# Patient Record
Sex: Female | Born: 1980 | Hispanic: No | Smoking: Never smoker
Health system: Southern US, Community
[De-identification: ages and names within clinical notes are randomized; demographics above are authoritative.]

## PROBLEM LIST (undated history)

## (undated) ENCOUNTER — Inpatient Hospital Stay (HOSPITAL_COMMUNITY): Payer: Self-pay

## (undated) DIAGNOSIS — D649 Anemia, unspecified: Secondary | ICD-10-CM

## (undated) HISTORY — PX: OTHER SURGICAL HISTORY: SHX169

## (undated) HISTORY — PX: AUGMENTATION MAMMAPLASTY: SUR837

## (undated) HISTORY — PX: NO PAST SURGERIES: SHX2092

## (undated) HISTORY — DX: Anemia, unspecified: D64.9

---

## 2011-02-09 DIAGNOSIS — K219 Gastro-esophageal reflux disease without esophagitis: Secondary | ICD-10-CM | POA: Insufficient documentation

## 2011-02-14 DIAGNOSIS — A048 Other specified bacterial intestinal infections: Secondary | ICD-10-CM | POA: Insufficient documentation

## 2011-03-10 DIAGNOSIS — G4730 Sleep apnea, unspecified: Secondary | ICD-10-CM | POA: Insufficient documentation

## 2011-10-04 ENCOUNTER — Ambulatory Visit (INDEPENDENT_AMBULATORY_CARE_PROVIDER_SITE_OTHER): Payer: BC Managed Care – PPO | Admitting: Physician Assistant

## 2011-10-04 VITALS — BP 121/64 | HR 90 | Temp 99.0°F | Resp 18 | Ht 65.0 in | Wt 124.0 lb

## 2011-10-04 DIAGNOSIS — N946 Dysmenorrhea, unspecified: Secondary | ICD-10-CM

## 2011-10-04 DIAGNOSIS — N949 Unspecified condition associated with female genital organs and menstrual cycle: Secondary | ICD-10-CM

## 2011-10-04 DIAGNOSIS — N644 Mastodynia: Secondary | ICD-10-CM

## 2011-10-04 DIAGNOSIS — N938 Other specified abnormal uterine and vaginal bleeding: Secondary | ICD-10-CM

## 2011-10-04 DIAGNOSIS — R102 Pelvic and perineal pain: Secondary | ICD-10-CM

## 2011-10-04 LAB — POCT CBC
Hemoglobin: 12.9 g/dL (ref 12.2–16.2)
MCHC: 31 g/dL — AB (ref 31.8–35.4)
MID (cbc): 0.6 (ref 0–0.9)
MPV: 12.7 fL (ref 0–99.8)
POC Granulocyte: 2.6 (ref 2–6.9)
POC MID %: 8.5 %M (ref 0–12)
Platelet Count, POC: 188 10*3/uL (ref 142–424)
RBC: 4.66 M/uL (ref 4.04–5.48)

## 2011-10-04 LAB — POCT URINALYSIS DIPSTICK
Blood, UA: NEGATIVE
Glucose, UA: NEGATIVE
Nitrite, UA: NEGATIVE
Protein, UA: NEGATIVE
Spec Grav, UA: 1.015
Urobilinogen, UA: 0.2

## 2011-10-04 LAB — POCT UA - MICROSCOPIC ONLY
Crystals, Ur, HPF, POC: NEGATIVE
RBC, urine, microscopic: NEGATIVE
Yeast, UA: NEGATIVE

## 2011-10-04 LAB — POCT URINE PREGNANCY: Preg Test, Ur: NEGATIVE

## 2011-10-04 LAB — POCT WET PREP WITH KOH
KOH Prep POC: NEGATIVE
Trichomonas, UA: NEGATIVE

## 2011-10-04 MED ORDER — NAPROXEN 500 MG PO TABS: ORAL_TABLET | ORAL | Status: DC

## 2011-10-04 NOTE — Progress Notes (Signed)
Subjective:    Patient ID: Cynthia Heath, female    DOB: 1980-03-21, 31 y.o.   MRN: [id number]  HPI 31 yr old female from [place] is brought in by a friend of her grandmother. She complains of R nipple pain on and off for years.  Worse when she in menstruating.  Last month she had 2 periods in the same month.  She always has dysmenorrhea, but it is worse right now than usual. Some has some itching and pain with urination. Some mild nausea, no vomiting. Last sexual encounter was in October 2012 and was a bad experience. She has been in [place] for 1 year and just moved to [facility]  Review of Systems  All other systems reviewed and are negative.       Objective:   Physical Exam  Nursing note and vitals reviewed. Constitutional: She is oriented to person, place, and time. She appears well-developed and well-nourished.  HENT:  Head: Normocephalic and atraumatic.  Cardiovascular: Normal rate, regular rhythm and normal heart sounds.   Pulmonary/Chest: Effort normal and breath sounds normal. Right breast exhibits tenderness (mild TTP of areola and nipple.). Right breast exhibits no inverted nipple, no mass, no nipple discharge and no skin change. Left breast exhibits no inverted nipple, no mass, no nipple discharge, no skin change and no tenderness. Breasts are symmetrical.  Abdominal: Soft. Bowel sounds are normal.  Genitourinary: Vagina normal and uterus normal.       Blood in vault and wiped away w/ large swab.  Wet prep and gen probe taken.  Neg CMT.  Bimanual unremarkable.  Neurological: She is alert and oriented to person, place, and time.  Skin: Skin is warm and dry.   Results for orders placed in visit on 10/04/11  POCT CBC      Component Value Range   WBC 7.5  4.6 - 10.2 K/uL   Lymph, poc 4.3 (*) 0.6 - 3.4   POC LYMPH PERCENT 56.8 (*) 10 - 50 %L   MID (cbc) 0.6  0 - 0.9   POC MID % 8.5  0 - 12 %M   POC Granulocyte 2.6  2 - 6.9   Granulocyte percent 34.7 (*) 37 - 80 %G   RBC 4.66   4.04 - 5.48 M/uL   Hemoglobin 12.9  12.2 - 16.2 g/dL   HCT, POC 13.0  86.5 - 47.9 %   MCV 89.3  80 - 97 fL   MCH, POC 27.7  27 - 31.2 pg   MCHC 31.0 (*) 31.8 - 35.4 g/dL   RDW, POC 78.4     Platelet Count, POC 188  142 - 424 K/uL   MPV 12.7  0 - 99.8 fL  POCT URINE PREGNANCY      Component Value Range   Preg Test, Ur Negative    POCT URINALYSIS DIPSTICK      Component Value Range   Color, UA yellow     Clarity, UA clear     Glucose, UA neg     Bilirubin, UA neg     Ketones, UA neg     Spec Grav, UA 1.015     Blood, UA neg     pH, UA 7.0     Protein, UA neg     Urobilinogen, UA 0.2     Nitrite, UA neg     Leukocytes, UA Negative    POCT UA - MICROSCOPIC ONLY      Component Value Range   WBC, Ur,  HPF, POC 0-2     RBC, urine, microscopic neg     Bacteria, U Microscopic neg     Mucus, UA neg     Epithelial cells, urine per micros 0-1     Crystals, Ur, HPF, POC neg     Casts, Ur, LPF, POC neg     Yeast, UA neg    POCT WET PREP WITH KOH      Component Value Range   Trichomonas, UA Negative     Clue Cells Wet Prep HPF POC 0-2     Epithelial Wet Prep HPF POC 0-10     Yeast Wet Prep HPF POC neg     Bacteria Wet Prep HPF POC 1+     RBC Wet Prep HPF POC tntc     WBC Wet Prep HPF POC 0-1     KOH Prep POC Negative         Assessment & Plan:  Dysfunctional uterine bleeding-unsure etiology-checking TSH.  If normal, can start OCPs depending on how frequently this happens and how much it bothers her. Pelvic pain/dysmenorrhea-rx sent Breast pain/worse with menstruation-present for years/normal breast exam. Continue monthly SBE for changes. Taught and encouraged monthly SBE after her period. Needs pap/CPE in the next few months.

## 2011-10-05 LAB — TSH: TSH: 2.426 u[IU]/mL (ref 0.350–4.500)

## 2011-10-06 LAB — GC/CHLAMYDIA PROBE AMP, GENITAL
Chlamydia, DNA Probe: POSITIVE — AB
GC Probe Amp, Genital: NEGATIVE

## 2011-10-06 MED ORDER — AZITHROMYCIN 250 MG PO TABS: ORAL_TABLET | ORAL | Status: AC

## 2011-10-06 NOTE — Addendum Note (Signed)
Addended by: Anders Simmonds on: 10/06/2011 05:09 PM   Modules accepted: Orders

## 2013-07-04 DIAGNOSIS — Z975 Presence of (intrauterine) contraceptive device: Secondary | ICD-10-CM | POA: Insufficient documentation

## 2013-07-04 DIAGNOSIS — G479 Sleep disorder, unspecified: Secondary | ICD-10-CM | POA: Insufficient documentation

## 2013-11-14 DIAGNOSIS — E66811 Obesity, class 1: Secondary | ICD-10-CM | POA: Insufficient documentation

## 2013-11-14 DIAGNOSIS — E669 Obesity, unspecified: Secondary | ICD-10-CM | POA: Insufficient documentation

## 2013-11-14 DIAGNOSIS — R7303 Prediabetes: Secondary | ICD-10-CM | POA: Insufficient documentation

## 2014-08-12 ENCOUNTER — Inpatient Hospital Stay (HOSPITAL_COMMUNITY)
Admission: AD | Admit: 2014-08-12 | Discharge: 2014-08-13 | Disposition: A | Discharge status: Home / Self Care | Payer: Medicaid Other | Source: Ambulatory Visit | Attending: Obstetrics & Gynecology | Admitting: Obstetrics & Gynecology

## 2014-08-12 ENCOUNTER — Encounter (HOSPITAL_COMMUNITY): Payer: Self-pay | Admitting: Medical

## 2014-08-12 DIAGNOSIS — Z3A01 Less than 8 weeks gestation of pregnancy: Secondary | ICD-10-CM | POA: Insufficient documentation

## 2014-08-12 DIAGNOSIS — O9989 Other specified diseases and conditions complicating pregnancy, childbirth and the puerperium: Secondary | ICD-10-CM | POA: Diagnosis not present

## 2014-08-12 DIAGNOSIS — R1011 Right upper quadrant pain: Secondary | ICD-10-CM | POA: Diagnosis present

## 2014-08-12 LAB — CBC WITH DIFFERENTIAL/PLATELET
BASOS ABS: 0 10*3/uL (ref 0.0–0.1)
BASOS PCT: 0 % (ref 0–1)
Band Neutrophils: 0 % (ref 0–10)
Blasts: 0 %
EOS ABS: 0.1 10*3/uL (ref 0.0–0.7)
EOS PCT: 1 % (ref 0–5)
HCT: 30.8 % — ABNORMAL LOW (ref 36.0–46.0)
HEMOGLOBIN: 10 g/dL — AB (ref 12.0–15.0)
LYMPHS PCT: 30 % (ref 12–46)
Lymphs Abs: 3.2 10*3/uL (ref 0.7–4.0)
MCH: 22.1 pg — ABNORMAL LOW (ref 26.0–34.0)
MCHC: 32.5 g/dL (ref 30.0–36.0)
MCV: 68.1 fL — AB (ref 78.0–100.0)
METAMYELOCYTES PCT: 0 %
MONO ABS: 1.1 10*3/uL — AB (ref 0.1–1.0)
MYELOCYTES: 0 %
Monocytes Relative: 10 % (ref 3–12)
NRBC: 0 /100{WBCs}
Neutro Abs: 6.4 10*3/uL (ref 1.7–7.7)
Neutrophils Relative %: 59 % (ref 43–77)
Other: 0 %
PROMYELOCYTES ABS: 0 %
Platelets: 283 10*3/uL (ref 150–400)
RBC: 4.52 MIL/uL (ref 3.87–5.11)
RDW: 17.5 % — ABNORMAL HIGH (ref 11.5–15.5)
WBC: 10.8 10*3/uL — AB (ref 4.0–10.5)

## 2014-08-12 LAB — URINALYSIS, ROUTINE W REFLEX MICROSCOPIC
BILIRUBIN URINE: NEGATIVE
GLUCOSE, UA: NEGATIVE mg/dL
HGB URINE DIPSTICK: NEGATIVE
KETONES UR: NEGATIVE mg/dL
LEUKOCYTES UA: NEGATIVE
Nitrite: NEGATIVE
Protein, ur: NEGATIVE mg/dL
SPECIFIC GRAVITY, URINE: 1.005 (ref 1.005–1.030)
Urobilinogen, UA: 0.2 mg/dL (ref 0.0–1.0)
pH: 6 (ref 5.0–8.0)

## 2014-08-12 LAB — COMPREHENSIVE METABOLIC PANEL
ALT: 15 U/L (ref 14–54)
AST: 17 U/L (ref 15–41)
Albumin: 3.9 g/dL (ref 3.5–5.0)
Alkaline Phosphatase: 44 U/L (ref 38–126)
Anion gap: 6 (ref 5–15)
BUN: 10 mg/dL (ref 6–20)
CHLORIDE: 106 mmol/L (ref 101–111)
CO2: 25 mmol/L (ref 22–32)
CREATININE: 0.76 mg/dL (ref 0.44–1.00)
Calcium: 9 mg/dL (ref 8.9–10.3)
GFR calc non Af Amer: >60 mL/min (ref >60)
GLUCOSE: 127 mg/dL — AB (ref 65–99)
Potassium: 3.9 mmol/L (ref 3.5–5.1)
SODIUM: 137 mmol/L (ref 135–145)
Total Bilirubin: 0.7 mg/dL (ref 0.3–1.2)
Total Protein: 7 g/dL (ref 6.5–8.1)

## 2014-08-12 LAB — OB RESULTS CONSOLE RPR: RPR: NONREACTIVE

## 2014-08-12 LAB — POCT PREGNANCY, URINE: Preg Test, Ur: POSITIVE — AB

## 2014-08-12 LAB — OB RESULTS CONSOLE HEPATITIS B SURFACE ANTIGEN: HEP B S AG: NEGATIVE

## 2014-08-12 LAB — OB RESULTS CONSOLE ANTIBODY SCREEN: ANTIBODY SCREEN: NEGATIVE

## 2014-08-12 MED ORDER — OXYCODONE HCL 5 MG PO TABS
5.0000 mg | ORAL_TABLET | Freq: Once | ORAL | Status: AC
  Administered 2014-08-13: 5 mg via ORAL
  Filled 2014-08-12: qty 1

## 2014-08-12 NOTE — MAU Note (Signed)
TODAY SHE  WENT  TO [facility]  TO CONFIRM  PREG-  TOLD  TO COME  HERE.    [place] GYN  DR.     FROM  [place]  SAYS SHE HAS ABD  PAIN    IN RIGHT UPPER SIDE  AND  BACK-  STARTED  ON Sunday-  HURTS  TO BREATHE-  NO  MEDS  FOR  PAIN.      NO VAG BLEEDING.      LAST SEX-   Thursday.

## 2014-08-12 NOTE — MAU Provider Note (Signed)
History     CSN: [id number]  Arrival date and time: 08/12/14 2026   First Provider Initiated Contact with Patient 08/12/14 2339      No chief complaint on file.  HPI  Ms. Cynthia Heath is a 34 y.o. [phone] at [redacted]w[redacted]d who presents to MAU today with complaint of RUQ abdominal pain since yesterday. She states pain seems to be getting worse. Pain is worse with deep inspiration and certain movements. She has not taken anything for pain. She states pain radiates around the side of her abdomen. She describes pain as sharp. She denies N/V/D, fever, UTI symptoms, vaginal bleeding or discharge. She denies any recent change in activity. Last PO intake at 2000 today.   OB History    Gravida Para Term Preterm AB TAB SAB Ectopic Multiple Living   Past Medical History  Diagnosis Date  . Preterm labor     Past Surgical History  Procedure Laterality Date  . No past surgeries      History reviewed. No pertinent family history.  History  Substance Use Topics  . Smoking status: Never Smoker   . Smokeless tobacco: Not on file  . Alcohol Use: Not on file    Allergies: No Known Allergies  No prescriptions prior to admission    Review of Systems  Constitutional: Negative for fever and malaise/fatigue.  Gastrointestinal: Positive for abdominal pain. Negative for nausea, vomiting, diarrhea and constipation.  Genitourinary: Negative for dysuria, urgency and frequency.       Neg -vaginal bleeding, discharge   Physical Exam   Blood pressure 103/65, pulse 85, temperature 98.9 F (37.2 C), temperature source Oral, resp. rate 16, height  (1.626 m), last menstrual period 06/25/2014.  Physical Exam  Nursing note and vitals reviewed. Constitutional: She is oriented to person, place, and time. She appears well-developed and well-nourished. No distress.  HENT:  Head: Normocephalic and atraumatic.  Cardiovascular: Normal rate.   Respiratory: Effort normal.  GI: Soft.  She exhibits no distension and no mass. There is tenderness (mild tenderness to palpation of the RUQ). There is no rebound, no guarding and no CVA tenderness.  Neurological: She is alert and oriented to person, place, and time.  Skin: Skin is warm and dry. No erythema.  Psychiatric: She has a normal mood and affect.   Results for orders placed or performed during the hospital encounter of 08/12/14 (from the past 24 hour(s))  Urinalysis, Routine w reflex microscopic (not at [facility])     Status: None   Collection Time: 08/12/14  8:53 PM  Result Value Ref Range   Color, Urine YELLOW YELLOW   APPearance CLEAR CLEAR   Specific Gravity, Urine 1.005 1.005 - 1.030   pH 6.0 5.0 - 8.0   Glucose, UA NEGATIVE NEGATIVE mg/dL   Hgb urine dipstick NEGATIVE NEGATIVE   Bilirubin Urine NEGATIVE NEGATIVE   Ketones, ur NEGATIVE NEGATIVE mg/dL   Protein, ur NEGATIVE NEGATIVE mg/dL   Urobilinogen, UA 0.2 0.0 - 1.0 mg/dL   Nitrite NEGATIVE NEGATIVE   Leukocytes, UA NEGATIVE NEGATIVE  Pregnancy, urine POC     Status: Abnormal   Collection Time: 08/12/14  8:59 PM  Result Value Ref Range   Preg Test, Ur POSITIVE (A) NEGATIVE  CBC with Differential/Platelet     Status: Abnormal   Collection Time: 08/12/14 10:44 PM  Result Value Ref Range   WBC 10.8 (H) 4.0 - 10.5  K/uL   RBC 4.52 3.87 - 5.11 MIL/uL   Hemoglobin 10.0 (L) 12.0 - 15.0 g/dL   HCT 16.130.8 (L) 09.636.0 - 04.546.0 %   MCV 68.1 (L) 78.0 - 100.0 fL   MCH 22.1 (L) 26.0 - 34.0 pg   MCHC 32.5 30.0 - 36.0 g/dL   RDW 40.917.5 (H) 81.111.5 - 91.415.5 %   Platelets 283 150 - 400 K/uL   Neutrophils Relative % 59 43 - 77 %   Lymphocytes Relative 30 12 - 46 %   Monocytes Relative 10 3 - 12 %   Eosinophils Relative 1 0 - 5 %   Basophils Relative 0 0 - 1 %   Band Neutrophils 0 0 - 10 %   Metamyelocytes Relative 0 %   Myelocytes 0 %   Promyelocytes Absolute 0 %   Blasts 0 %   nRBC 0 0 /100 WBC   Other 0 %   Neutro Abs 6.4 1.7 - 7.7 K/uL   Lymphs Abs 3.2 0.7 - 4.0 K/uL    Monocytes Absolute 1.1 (H) 0.1 - 1.0 K/uL   Eosinophils Absolute 0.1 0.0 - 0.7 K/uL   Basophils Absolute 0.0 0.0 - 0.1 K/uL   RBC Morphology SCHISTOCYTES NOTED ON SMEAR   Comprehensive metabolic panel     Status: Abnormal   Collection Time: 08/12/14 10:44 PM  Result Value Ref Range   Sodium 137 135 - 145 mmol/L   Potassium 3.9 3.5 - 5.1 mmol/L   Chloride 106 101 - 111 mmol/L   CO2 25 22 - 32 mmol/L   Glucose, Bld 127 (H) 65 - 99 mg/dL   BUN 10 6 - 20 mg/dL   Creatinine, Ser 7.820.76 0.44 - 1.00 mg/dL   Calcium 9.0 8.9 - 95.610.3 mg/dL   Total Protein 7.0 6.5 - 8.1 g/dL   Albumin 3.9 3.5 - 5.0 g/dL   AST 17 15 - 41 U/L   ALT 15 14 - 54 U/L   Alkaline Phosphatase 44 38 - 126 U/L   Total Bilirubin 0.7 0.3 - 1.2 mg/dL   GFR calc non Af Amer >60 >60 mL/min   GFR calc Af Amer >60 >60 mL/min   Anion gap 6 5 - 15  Lipase, blood     Status: None   Collection Time: 08/12/14 10:44 PM  Result Value Ref Range   Lipase 26 22 - 51 U/L    MAU Course  Procedures None  MDM UPT - negative UA, CBC, CMP and Lipase today Labs show no evidence of liver, gallbladder, kidney or pancreatic issues as a source of pain Most likely musculoskeletal.  Assessment and Plan  A: SIUP at [id number] RUQ abdominal pain  P: Discharge home Rx for Percocet given to patient First trimester precautions discussed Patient advised [facility] of [facility] with 2 previous pregnancies Patient may return to MAU as needed or if her condition were to change or worsen   Marny LowensteinJulie N Wenzel, PA-C  08/13/2014, 2:21 AM

## 2014-08-12 NOTE — MAU Note (Signed)
Pt not in lobby x 2 per phlebotomy

## 2014-08-13 LAB — LIPASE, BLOOD: Lipase: 26 U/L (ref 22–51)

## 2014-08-14 ENCOUNTER — Encounter (HOSPITAL_COMMUNITY): Payer: Self-pay | Admitting: *Deleted

## 2014-08-14 ENCOUNTER — Inpatient Hospital Stay (HOSPITAL_COMMUNITY): Payer: Medicaid Other

## 2014-08-14 ENCOUNTER — Inpatient Hospital Stay (HOSPITAL_COMMUNITY)
Admission: AD | Admit: 2014-08-14 | Discharge: 2014-08-14 | Disposition: A | Discharge status: Home / Self Care | Payer: Medicaid Other | Source: Ambulatory Visit | Attending: Obstetrics & Gynecology | Admitting: Obstetrics & Gynecology

## 2014-08-14 DIAGNOSIS — Z3A01 Less than 8 weeks gestation of pregnancy: Secondary | ICD-10-CM | POA: Diagnosis not present

## 2014-08-14 DIAGNOSIS — R1031 Right lower quadrant pain: Secondary | ICD-10-CM | POA: Insufficient documentation

## 2014-08-14 DIAGNOSIS — N831 Corpus luteum cyst of ovary, unspecified side: Secondary | ICD-10-CM

## 2014-08-14 DIAGNOSIS — O26899 Other specified pregnancy related conditions, unspecified trimester: Secondary | ICD-10-CM

## 2014-08-14 DIAGNOSIS — R1011 Right upper quadrant pain: Secondary | ICD-10-CM | POA: Insufficient documentation

## 2014-08-14 DIAGNOSIS — O9989 Other specified diseases and conditions complicating pregnancy, childbirth and the puerperium: Secondary | ICD-10-CM | POA: Diagnosis not present

## 2014-08-14 DIAGNOSIS — O3481 Maternal care for other abnormalities of pelvic organs, first trimester: Secondary | ICD-10-CM | POA: Insufficient documentation

## 2014-08-14 LAB — URINALYSIS, ROUTINE W REFLEX MICROSCOPIC
Bilirubin Urine: NEGATIVE
Glucose, UA: NEGATIVE mg/dL
Hgb urine dipstick: NEGATIVE
Ketones, ur: NEGATIVE mg/dL
Leukocytes, UA: NEGATIVE
Nitrite: NEGATIVE
Protein, ur: NEGATIVE mg/dL
SPECIFIC GRAVITY, URINE: 1.025 (ref 1.005–1.030)
UROBILINOGEN UA: 0.2 mg/dL (ref 0.0–1.0)
pH: 6 (ref 5.0–8.0)

## 2014-08-14 LAB — COMPREHENSIVE METABOLIC PANEL
ALK PHOS: 45 U/L (ref 38–126)
ALT: 16 U/L (ref 14–54)
AST: 13 U/L — ABNORMAL LOW (ref 15–41)
Albumin: 3.7 g/dL (ref 3.5–5.0)
Anion gap: 5 (ref 5–15)
BUN: 10 mg/dL (ref 6–20)
CHLORIDE: 105 mmol/L (ref 101–111)
CO2: 24 mmol/L (ref 22–32)
Calcium: 8.6 mg/dL — ABNORMAL LOW (ref 8.9–10.3)
Creatinine, Ser: 0.68 mg/dL (ref 0.44–1.00)
GLUCOSE: 97 mg/dL (ref 65–99)
Potassium: 3.7 mmol/L (ref 3.5–5.1)
SODIUM: 134 mmol/L — AB (ref 135–145)
Total Bilirubin: 0.5 mg/dL (ref 0.3–1.2)
Total Protein: 6.7 g/dL (ref 6.5–8.1)

## 2014-08-14 LAB — CBC
HCT: 30.2 % — ABNORMAL LOW (ref 36.0–46.0)
Hemoglobin: 10.3 g/dL — ABNORMAL LOW (ref 12.0–15.0)
MCH: 23.1 pg — AB (ref 26.0–34.0)
MCHC: 34.1 g/dL (ref 30.0–36.0)
MCV: 67.7 fL — AB (ref 78.0–100.0)
Platelets: 284 10*3/uL (ref 150–400)
RBC: 4.46 MIL/uL (ref 3.87–5.11)
RDW: 17.2 % — ABNORMAL HIGH (ref 11.5–15.5)
WBC: 9.7 10*3/uL (ref 4.0–10.5)

## 2014-08-14 LAB — ABO/RH: ABO/RH(D): O POS

## 2014-08-14 LAB — OB RESULTS CONSOLE GC/CHLAMYDIA: Gonorrhea: NEGATIVE

## 2014-08-14 LAB — WET PREP, GENITAL
Clue Cells Wet Prep HPF POC: NONE SEEN
Trich, Wet Prep: NONE SEEN
Yeast Wet Prep HPF POC: NONE SEEN

## 2014-08-14 LAB — HCG, QUANTITATIVE, PREGNANCY: HCG, BETA CHAIN, QUANT, S: 88612 m[IU]/mL — AB (ref <5)

## 2014-08-14 LAB — LIPASE, BLOOD: LIPASE: 21 U/L — AB (ref 22–51)

## 2014-08-14 MED ORDER — OXYCODONE-ACETAMINOPHEN 5-325 MG PO TABS
1.0000 | ORAL_TABLET | Freq: Once | ORAL | Status: AC
  Administered 2014-08-14: 1 via ORAL
  Filled 2014-08-14: qty 1

## 2014-08-14 NOTE — Discharge Instructions (Signed)

## 2014-08-14 NOTE — MAU Provider Note (Signed)
Chief Complaint: Flank Pain   First Provider Initiated Contact with Patient 08/14/14 1512      SUBJECTIVE HPI: Cynthia Heath is a 34 y.o. [id number] at [redacted]w[redacted]d by LMP who presents to Maternity Admissions reporting right upper quadrant pain radiating down to right groin. Pain started 3 days ago and was initially only in the right upper quadrant. She was evaluated in maternity admissions 08/12/2014. Normal CBC, UA and CMP and lipase. Right upper quadrant ultrasound not performed due non-emergent presentation, patient having eaten recently. He and has worsened since then and moved down into the low abdomen and groin. Has not taken any of the prescribed pain medication because she is nervous about it's safety in pregnancy. She denies fever, chills, vaginal bleeding or vaginal discharge. States she's not had any ultrasounds or pregnancy-related blood work this pregnancy. No similar episodes of pain in the past. She rates the pain 9/10 on pain scale, constant, sharp. Worse with movement and in certain positions. Slightly better with warm compress. No relationship to eating, voiding or bowel movements.   Past Medical History  Diagnosis Date  . Preterm labor    OB History  Gravida Para Term Preterm AB SAB TAB Ectopic Multiple Living  3 2  2      2     # Outcome Date GA Lbr Len/2nd Weight Sex Delivery Anes PTL Lv  3 Current           2 Preterm  [redacted]w[redacted]d    Vag-Spont   Y  1 Preterm  [redacted]w[redacted]d    Vag-Spont   Y     Past Surgical History  Procedure Laterality Date  . No past surgeries     History   Social History  . Marital Status: Married    Spouse Name: N/A  . Number of Children: N/A  . Years of Education: N/A   Occupational History  . Not on file.   Social History Main Topics  . Smoking status: Never Smoker   . Smokeless tobacco: Not on file  . Alcohol Use: No  . Drug Use: No  . Sexual Activity: Yes    Birth Control/ Protection: None   Other Topics Concern  . Not on file   Social History  Narrative   No current facility-administered medications on file prior to encounter.   Current Outpatient Prescriptions on File Prior to Encounter  Medication Sig Dispense Refill  . Prenatal Vit-Fe Fumarate-FA (PRENATAL MULTIVITAMIN) TABS tablet Take 1 tablet by mouth daily at 12 noon.     No Known Allergies  Review of Systems  Constitutional: Negative for fever and chills.  Gastrointestinal: Positive for abdominal pain. Negative for nausea, vomiting, diarrhea and constipation.  Genitourinary: Negative for dysuria, urgency, frequency, hematuria and flank pain.       Negative for vaginal bleeding or vaginal discharge.   OBJECTIVE Blood pressure 108/66, pulse 81, resp. rate 18, height 5\' 6"  (1.676 m), weight 176 lb 2 oz (79.89 kg), last menstrual period 06/25/2014, SpO2 100 %. GENERAL: Well-developed, well-nourished female in mild distress, moderate distress with movement.Marland Kitchen  HEART: normal rate RESP: normal effort GI: Abdomen soft, mild tenderness from right upper quadrant down right side to right groin. Negative guarding, rebound tenderness or mass. Positive bowel sounds 4. MS: Nontender, no edema NEURO: Alert and oriented SPECULUM EXAM: NEFG, physiologic discharge, no blood noted, cervix clean,  BIMANUAL: cervix closed; uterus slightly enlarged, no adnexal tenderness or masses No CVAT.   LAB RESULTS Results for orders placed or performed  during the hospital encounter of 08/14/14 (from the past 24 hour(s))  Urinalysis, Routine w reflex microscopic (not at [facility])     Status: None   Collection Time: 08/14/14  1:35 PM  Result Value Ref Range   Color, Urine YELLOW YELLOW   APPearance CLEAR CLEAR   Specific Gravity, Urine 1.025 1.005 - 1.030   pH 6.0 5.0 - 8.0   Glucose, UA NEGATIVE NEGATIVE mg/dL   Hgb urine dipstick NEGATIVE NEGATIVE   Bilirubin Urine NEGATIVE NEGATIVE   Ketones, ur NEGATIVE NEGATIVE mg/dL   Protein, ur NEGATIVE NEGATIVE mg/dL   Urobilinogen, UA 0.2 0.0 - 1.0  mg/dL   Nitrite NEGATIVE NEGATIVE   Leukocytes, UA NEGATIVE NEGATIVE  hCG, quantitative, pregnancy     Status: Abnormal   Collection Time: 08/14/14  3:52 PM  Result Value Ref Range   hCG, Beta Chain, Quant, S 29562 (H) <5 mIU/mL  ABO/Rh     Status: None (Preliminary result)   Collection Time: 08/14/14  3:52 PM  Result Value Ref Range   ABO/RH(D) O POS   CBC     Status: Abnormal   Collection Time: 08/14/14  3:52 PM  Result Value Ref Range   WBC 9.7 4.0 - 10.5 K/uL   RBC 4.46 3.87 - 5.11 MIL/uL   Hemoglobin 10.3 (L) 12.0 - 15.0 g/dL   HCT 13.0 (L) 86.5 - 78.4 %   MCV 67.7 (L) 78.0 - 100.0 fL   MCH 23.1 (L) 26.0 - 34.0 pg   MCHC 34.1 30.0 - 36.0 g/dL   RDW 69.6 (H) 29.5 - 28.4 %   Platelets 284 150 - 400 K/uL  Lipase, blood     Status: Abnormal   Collection Time: 08/14/14  3:52 PM  Result Value Ref Range   Lipase 21 (L) 22 - 51 U/L  Comprehensive metabolic panel     Status: Abnormal   Collection Time: 08/14/14  3:52 PM  Result Value Ref Range   Sodium 134 (L) 135 - 145 mmol/L   Potassium 3.7 3.5 - 5.1 mmol/L   Chloride 105 101 - 111 mmol/L   CO2 24 22 - 32 mmol/L   Glucose, Bld 97 65 - 99 mg/dL   BUN 10 6 - 20 mg/dL   Creatinine, Ser 1.32 0.44 - 1.00 mg/dL   Calcium 8.6 (L) 8.9 - 10.3 mg/dL   Total Protein 6.7 6.5 - 8.1 g/dL   Albumin 3.7 3.5 - 5.0 g/dL   AST 13 (L) 15 - 41 U/L   ALT 16 14 - 54 U/L   Alkaline Phosphatase 45 38 - 126 U/L   Total Bilirubin 0.5 0.3 - 1.2 mg/dL   GFR calc non Af Amer >60 >60 mL/min   GFR calc Af Amer >60 >60 mL/min   Anion gap 5 5 - 15  Wet prep, genital     Status: Abnormal   Collection Time: 08/14/14  5:20 PM  Result Value Ref Range   Yeast Wet Prep HPF POC NONE SEEN NONE SEEN   Trich, Wet Prep NONE SEEN NONE SEEN   Clue Cells Wet Prep HPF POC NONE SEEN NONE SEEN   WBC, Wet Prep HPF POC FEW (A) NONE SEEN    IMAGING US Ob Comp Less 14 Wks  08/14/2014   CLINICAL DATA:  Early pregnancy with right-sided abdominal pain. Estimated  gestational age of [redacted] weeks and 1 day based on last menstrual period.  EXAM: OBSTETRIC <14 WK [place] AND TRANSVAGINAL OB US  TECHNIQUE: Both  transabdominal and transvaginal ultrasound examinations were performed for complete evaluation of the gestation as well as the maternal uterus, adnexal regions, and pelvic cul-de-sac. Transvaginal technique was performed to assess early pregnancy.  COMPARISON:  None.  FINDINGS: Intrauterine gestational sac: A single intrauterine gestational sac is identified which is somewhat elongated and irregular in shape. No evidence of subchorionic hemorrhage.  Yolk sac:  Identified.  Embryo:  Identified.  Cardiac Activity: Regular cardiac activity identified.  Heart Rate: 128  bpm  MSD:   mm    w     d  CRL:  6  mm   6 w   3 d                  [place] EDC: 04/06/2015  Maternal uterus/adnexae: The ovaries have a normal appearance bilaterally. No adnexal masses. A small to moderate amount of free fluid is present in the pelvis which appears simple in appearance by ultrasound.  IMPRESSION: Viable single intrauterine gestation with estimated gestational age of [redacted] weeks and 3 days by ultrasound. Ultrasound EDC is 04/06/2015. The only potential abnormality by ultrasound is a somewhat irregular shaped gestational sac. The single embryo visualized does demonstrate detectable cardiac activity.   Electronically Signed   By: Irish Lack M.D.   On: 08/14/2014 17:19   US Ob Transvaginal  08/14/2014   CLINICAL DATA:  Early pregnancy with right-sided abdominal pain. Estimated gestational age of [redacted] weeks and 1 day based on last menstrual period.  EXAM: OBSTETRIC <14 WK [place] AND TRANSVAGINAL OB US  TECHNIQUE: Both transabdominal and transvaginal ultrasound examinations were performed for complete evaluation of the gestation as well as the maternal uterus, adnexal regions, and pelvic cul-de-sac. Transvaginal technique was performed to assess early pregnancy.  COMPARISON:  None.  FINDINGS: Intrauterine gestational  sac: A single intrauterine gestational sac is identified which is somewhat elongated and irregular in shape. No evidence of subchorionic hemorrhage.  Yolk sac:  Identified.  Embryo:  Identified.  Cardiac Activity: Regular cardiac activity identified.  Heart Rate: 128  bpm  MSD:   mm    w     d  CRL:  6  mm   6 w   3 d                  [place] EDC: 04/06/2015  Maternal uterus/adnexae: The ovaries have a normal appearance bilaterally. No adnexal masses. A small to moderate amount of free fluid is present in the pelvis which appears simple in appearance by ultrasound.  IMPRESSION: Viable single intrauterine gestation with estimated gestational age of [redacted] weeks and 3 days by ultrasound. Ultrasound EDC is 04/06/2015. The only potential abnormality by ultrasound is a somewhat irregular shaped gestational sac. The single embryo visualized does demonstrate detectable cardiac activity.   Electronically Signed   By: Irish Lack M.D.   On: 08/14/2014 17:19   US Abdomen Limited Ruq  08/14/2014   CLINICAL DATA:  Right upper quadrant pain and right lower quadrant pain  EXAM: US ABDOMEN LIMITED - RIGHT UPPER QUADRANT  COMPARISON:  None.  FINDINGS: Gallbladder:  No gallstones or wall thickening visualized. No sonographic Murphy sign noted.  Common bile duct:  Diameter: 3 mm  Liver:  No focal lesion identified. Within normal limits in parenchymal echogenicity.  IMPRESSION: Normal right upper quadrant ultrasound   Electronically Signed   By: Esperanza Heir M.D.   On: 08/14/2014 17:19    MAU COURSE ASSESSMENT 1. Corpus luteum cyst  2. RUQ pain   3. Pregnancy with abdominal pain of right lower quadrant, antepartum     PLAN Discharge home in stable condition. Pregnancy verification letter given. List of providers given. Comfort measures, Tylenol as needed. May use Percocet that she already has sparingly.     Follow-up Information    Follow up with Obstetrician of your choice.   Why:  Start prenatal care      Follow  up with [facility].   Why:  As needed in emergencies   Contact information:   [address] [id number] mc [place] [address] [phone]       Medication List    TAKE these medications        prenatal multivitamin Tabs tablet  Take 1 tablet by mouth daily at 12 noon.       Percocet when necessary  [place], CNM 08/14/2014  6:16 PM
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
## 2014-08-14 NOTE — MAU Note (Signed)
Give pain meds here two days ago. Pt states pain did not improve with medication given here.  Did not fill RX.  Pt states she feels really weak.  Denies vaginal bleeding.

## 2014-08-15 LAB — GC/CHLAMYDIA PROBE AMP (~~LOC~~) NOT AT ARMC
CHLAMYDIA, DNA PROBE: NEGATIVE
Neisseria Gonorrhea: NEGATIVE

## 2014-08-15 LAB — HIV ANTIBODY (ROUTINE TESTING W REFLEX): HIV Screen 4th Generation wRfx: NONREACTIVE

## 2014-09-04 ENCOUNTER — Encounter: Payer: Medicaid Other | Admitting: Obstetrics & Gynecology

## 2014-09-11 LAB — OB RESULTS CONSOLE RUBELLA ANTIBODY, IGM: RUBELLA: IMMUNE

## 2015-03-08 NOTE — L&D Delivery Note (Cosign Needed)
Delivery Note 0908: Nurse call reports patient arrival in MAU and C/C status.  To unit and room to assess.  Faculty midwife in room and infant on perineum.  Patient instructed on breathing methods and encouragements given.  Patient delivered as below with staff support.  FHR remained reassuring, but moderate meconium noted after delivery of head.    At 9:12 AM, on Mar 16, 2015, a viable female was delivered via Vaginal, Spontaneous Delivery (Presentation: Left Occiput Anterior with restitution to LOT). Shoulders delivered easily and infant with good tone and spontaneous cry. Tactile stimulation and bulb suction given by provider and infant placed on mother's abdomen where provider continued tactile stimulation.  Infant  APGAR: 9, 9. Cord clamped, cut, and blood collected. Placenta delivered spontaneously and noted to be intact with 3VC upon inspection.  Vaginal inspection revealed a 1st degree perineal laceration that was repaired with 3-0 vicryl on SH and a right labial laceration that was hemostatic, but repaired at the patient's request with a 3.0 vicryl on a SH.  1% lidocaine was used for local anesthetic and patient tolerated the procedure well. Fundus firm, at the umbilicus, and bleeding small.  Mother hemodynamically stable and infant skin to skin prior to provider exit.  Mother desires for birth control not addressed, but she does opt to breastfeed.  Infant weight at one hour of life: Not assessed  Anesthesia: None  Episiotomy: None Lacerations: 1st degree Suture Repair: 3.0 vicryl Est. Blood Loss (mL): 200  Mom to postpartum.  Baby to Couplet care / Skin to Skin.  Cherre RobinsJessica L Jaymarion Trombly MSN, CNM 03/16/2015, 9:55 AM

## 2015-03-12 LAB — OB RESULTS CONSOLE GBS: GBS: NEGATIVE

## 2015-03-16 ENCOUNTER — Inpatient Hospital Stay (HOSPITAL_COMMUNITY)
Admission: AD | Admit: 2015-03-16 | Discharge: 2015-03-18 | DRG: 775 | Disposition: A | Discharge status: Home / Self Care | Payer: Medicaid Other | Source: Ambulatory Visit | Attending: Obstetrics & Gynecology | Admitting: Obstetrics & Gynecology

## 2015-03-16 ENCOUNTER — Encounter (HOSPITAL_COMMUNITY): Payer: Self-pay

## 2015-03-16 DIAGNOSIS — Z3A37 37 weeks gestation of pregnancy: Secondary | ICD-10-CM

## 2015-03-16 DIAGNOSIS — IMO0001 Reserved for inherently not codable concepts without codable children: Secondary | ICD-10-CM

## 2015-03-16 LAB — CBC
HCT: 32.4 % — ABNORMAL LOW (ref 36.0–46.0)
Hemoglobin: 10.5 g/dL — ABNORMAL LOW (ref 12.0–15.0)
MCH: 21.7 pg — AB (ref 26.0–34.0)
MCHC: 32.4 g/dL (ref 30.0–36.0)
MCV: 66.9 fL — AB (ref 78.0–100.0)
Platelets: 265 10*3/uL (ref 150–400)
RBC: 4.84 MIL/uL (ref 3.87–5.11)
RDW: 17.5 % — ABNORMAL HIGH (ref 11.5–15.5)
WBC: 13.1 10*3/uL — ABNORMAL HIGH (ref 4.0–10.5)

## 2015-03-16 LAB — TYPE AND SCREEN
ABO/RH(D): O POS
Antibody Screen: NEGATIVE

## 2015-03-16 MED ORDER — SENNOSIDES-DOCUSATE SODIUM 8.6-50 MG PO TABS
2.0000 | ORAL_TABLET | ORAL | Status: DC
  Administered 2015-03-17 – 2015-03-18 (×2): 2 via ORAL
  Filled 2015-03-16 (×2): qty 2

## 2015-03-16 MED ORDER — LACTATED RINGERS IV SOLN: INTRAVENOUS | Status: DC

## 2015-03-16 MED ORDER — TETANUS-DIPHTH-ACELL PERTUSSIS 5-2.5-18.5 LF-MCG/0.5 IM SUSP: 0.5000 mL | Freq: Once | INTRAMUSCULAR | Status: DC

## 2015-03-16 MED ORDER — IBUPROFEN 600 MG PO TABS
600.0000 mg | ORAL_TABLET | Freq: Four times a day (QID) | ORAL | Status: DC
  Administered 2015-03-16 – 2015-03-18 (×8): 600 mg via ORAL
  Filled 2015-03-16 (×8): qty 1

## 2015-03-16 MED ORDER — FENTANYL CITRATE (PF) 100 MCG/2ML IJ SOLN: 50.0000 ug | INTRAMUSCULAR | Status: DC | PRN

## 2015-03-16 MED ORDER — ACETAMINOPHEN 325 MG PO TABS: 650.0000 mg | ORAL_TABLET | ORAL | Status: DC | PRN

## 2015-03-16 MED ORDER — DIPHENHYDRAMINE HCL 25 MG PO CAPS
25.0000 mg | ORAL_CAPSULE | Freq: Four times a day (QID) | ORAL | Status: DC | PRN
Start: 2015-03-16 — End: 2015-03-18

## 2015-03-16 MED ORDER — CITRIC ACID-SODIUM CITRATE 334-500 MG/5ML PO SOLN: 30.0000 mL | ORAL | Status: DC | PRN

## 2015-03-16 MED ORDER — ONDANSETRON HCL 4 MG/2ML IJ SOLN: 4.0000 mg | Freq: Four times a day (QID) | INTRAMUSCULAR | Status: DC | PRN

## 2015-03-16 MED ORDER — ONDANSETRON HCL 4 MG/2ML IJ SOLN: 4.0000 mg | INTRAMUSCULAR | Status: DC | PRN

## 2015-03-16 MED ORDER — LANOLIN HYDROUS EX OINT: TOPICAL_OINTMENT | CUTANEOUS | Status: DC | PRN

## 2015-03-16 MED ORDER — DIBUCAINE 1 % RE OINT: 1.0000 "application " | TOPICAL_OINTMENT | RECTAL | Status: DC | PRN

## 2015-03-16 MED ORDER — LIDOCAINE HCL (PF) 1 % IJ SOLN
INTRAMUSCULAR | Status: AC
  Administered 2015-03-16: 30 mL
  Filled 2015-03-16: qty 30

## 2015-03-16 MED ORDER — OXYCODONE-ACETAMINOPHEN 5-325 MG PO TABS
1.0000 | ORAL_TABLET | ORAL | Status: DC | PRN
  Administered 2015-03-16 – 2015-03-18 (×7): 1 via ORAL
  Filled 2015-03-16 (×7): qty 1

## 2015-03-16 MED ORDER — PRENATAL MULTIVITAMIN CH
1.0000 | ORAL_TABLET | Freq: Every day | ORAL | Status: DC
  Administered 2015-03-16 – 2015-03-17 (×2): 1 via ORAL
  Filled 2015-03-16 (×2): qty 1

## 2015-03-16 MED ORDER — ZOLPIDEM TARTRATE 5 MG PO TABS: 5.0000 mg | ORAL_TABLET | Freq: Every evening | ORAL | Status: DC | PRN

## 2015-03-16 MED ORDER — LACTATED RINGERS IV SOLN: 2.5000 [IU]/h | INTRAVENOUS | Status: DC

## 2015-03-16 MED ORDER — OXYTOCIN 10 UNIT/ML IJ SOLN
INTRAMUSCULAR | Status: AC
  Administered 2015-03-16: 10 [IU]
  Filled 2015-03-16: qty 1

## 2015-03-16 MED ORDER — LACTATED RINGERS IV SOLN: 500.0000 mL | INTRAVENOUS | Status: DC | PRN

## 2015-03-16 MED ORDER — BENZOCAINE-MENTHOL 20-0.5 % EX AERO
1.0000 "application " | INHALATION_SPRAY | CUTANEOUS | Status: DC | PRN
  Administered 2015-03-16: 1 via TOPICAL
  Filled 2015-03-16 (×2): qty 56

## 2015-03-16 MED ORDER — OXYCODONE-ACETAMINOPHEN 5-325 MG PO TABS
2.0000 | ORAL_TABLET | ORAL | Status: DC | PRN
Start: 2015-03-16 — End: 2015-03-18

## 2015-03-16 MED ORDER — ONDANSETRON HCL 4 MG PO TABS: 4.0000 mg | ORAL_TABLET | ORAL | Status: DC | PRN

## 2015-03-16 MED ORDER — OXYTOCIN BOLUS FROM INFUSION: 500.0000 mL | INTRAVENOUS | Status: DC

## 2015-03-16 MED ORDER — SIMETHICONE 80 MG PO CHEW
80.0000 mg | CHEWABLE_TABLET | ORAL | Status: DC | PRN
Start: 2015-03-16 — End: 2015-03-18

## 2015-03-16 MED ORDER — WITCH HAZEL-GLYCERIN EX PADS: 1.0000 "application " | MEDICATED_PAD | CUTANEOUS | Status: DC | PRN

## 2015-03-16 MED ORDER — LIDOCAINE HCL (PF) 1 % IJ SOLN
30.0000 mL | INTRAMUSCULAR | Status: DC | PRN
  Filled 2015-03-16: qty 30

## 2015-03-16 NOTE — H&P (Signed)
Cynthia Heath is a 35 y.o. female presenting for contractions and SROM.  Patient reports that SROM occurred at 0600 and was initially clear.  Patient states she started contracting soon after and fluid was noted to be brownish green in color.  Patient states contractions did not become painful until prior to arrival at hospital.  Maternal Medical History:  Reason for admission: Rupture of membranes and contractions.   Contractions: Onset was 3-5 hours ago.   Frequency: regular.    Fetal activity: Perceived fetal activity is normal.   Last perceived fetal movement was within the past hour.    Prenatal complications: Patient with h/o PTD.  17P injections until 36 wks.   Prenatal Complications - Diabetes: none.   U/S (Low maternal wgt gain) U/S: Singleton pregnancy. Vertex presentation. Posterior placenta. AFI is normal-55th%. Cervix closed. Adnexas are unremarkable. [id number]:[id number]:1991g(4lbs6oz) OB History    Gravida Para Term Preterm AB TAB SAB Ectopic Multiple Living   3 3 1 2      0 3     Past Medical History  Diagnosis Date  . Preterm labor    Past Surgical History  Procedure Laterality Date  . No past surgeries     Family History: family history is not on file. Social History:  reports that she has never smoked. She does not have any smokeless tobacco history on file. She reports that she does not drink alcohol or use illicit drugs.   Prenatal Transfer Tool  Maternal Diabetes: No Genetic Screening: Normal Maternal Ultrasounds/Referrals: Normal Fetal Ultrasounds or other Referrals:  None Maternal Substance Abuse:  No Significant Maternal Medications:  None Significant Maternal Lab Results:  None Other Comments:  HgB Electro: AF-No clinical significance.  Review of Systems  Unable to perform ROS     Last menstrual period 06/25/2014, unknown if currently breastfeeding. Maternal Exam:  Uterine Assessment: Contraction strength is moderate.  Contraction frequency is regular.    Abdomen: Patient reports no abdominal tenderness. Fundal height is AGA.   Fetal presentation: vertex  Introitus: Normal vulva. Normal vagina.  Ferning test: not done.  Nitrazine test: not done. Amniotic fluid character: meconium stained.  Pelvis: adequate for delivery.   Cervix: not evaluated.   Fetal Exam Fetal Monitor Review: Mode: fetoscope.   Baseline rate: 135.  Variability: moderate (6-25 bpm).   Pattern: accelerations present.    Fetal State Assessment: Category I - tracings are normal.     Physical Exam  Constitutional: She is oriented to person, place, and time. She appears well-developed and well-nourished. She appears distressed.  HENT:  Head: Normocephalic and atraumatic.  Eyes: EOM are normal.  Neck: Normal range of motion.  Cardiovascular: Normal rate.   Musculoskeletal: Normal range of motion.  Neurological: She is alert and oriented to person, place, and time.  Skin: Skin is warm and dry.    Prenatal labs: ABO, Rh: --/--/O POS (06/09 1552) Antibody: NEG (01/09 1025) Rubella: Immune (07/07 0000) RPR: Nonreactive (06/07 0000)  HBsAg: Negative (06/07 0000)  HIV: Non Reactive (06/09 1552)  GBS: Negative (01/05 0000)   Assessment IUP at 37.1wks Reactive NST 2nd Stage Labor-Actively Pushing GBS Negative  Plan: Imminent delivery completed in MAU rm 10 Patient and infant tolerated well Repair of 1st degree perineal laceration and right labial Standard Labor and Delivery Orders Recovery care to be completed in MAU Tranfer to Postpartum care-M[facility] Routine Vaginal Delivery Orders per [facility] Protocol Dr.EK to be updated as appropriate  Cynthia Heath 03/16/2015, 10:02 AM

## 2015-03-16 NOTE — Progress Notes (Signed)
UR chart review completed.  

## 2015-03-16 NOTE — Lactation Note (Signed)
This note was copied from the chart of Cynthia Galen DaftFranca Dittus. Lactation Consultation Note  Initial visit done.  Breastfeeding consultation services and support information given and reviewed.  Baby is 4 hours old and has been to breast twice.  Mom currently holding baby on chest skin to skin.  Attempted to latch baby to breast but baby sleepy and showing no interest.  Instructed to feed with any feeding cue and call for assist prn.  Patient Name: Cynthia Heath Today's Date: 03/16/2015 Reason for consult: Initial assessment   Maternal Data Formula Feeding for Exclusion: No Has patient been taught Hand Expression?: Yes Does the patient have breastfeeding experience prior to this delivery?: Yes  Feeding Feeding Type: Breast Fed Length of feed: 20 min  LATCH Score/Interventions Latch: Too sleepy or reluctant, no latch achieved, no sucking elicited.  Audible Swallowing: None  Type of Nipple: Everted at rest and after stimulation  Comfort (Breast/Nipple): Soft / non-tender     Hold (Positioning): Assistance needed to correctly position infant at breast and maintain latch. Intervention(s): Breastfeeding basics reviewed;Support Pillows;Skin to skin  LATCH Score: 5  Lactation Tools Discussed/Used     Consult Status Consult Status: Follow-up Date: 03/17/15 Follow-up type: In-patient    Huston FoleyMOULDEN, Latrise Bowland S 03/16/2015, 1:56 PM

## 2015-03-16 NOTE — MAU Note (Signed)
Pt complete on arrival to MAU, SROM @ 0630, mec stained fluid.  Vag del @ [phone], female infant.

## 2015-03-17 LAB — CBC
HEMATOCRIT: 28.4 % — AB (ref 36.0–46.0)
HEMOGLOBIN: 9.1 g/dL — AB (ref 12.0–15.0)
MCH: 21.5 pg — ABNORMAL LOW (ref 26.0–34.0)
MCHC: 32 g/dL (ref 30.0–36.0)
MCV: 67.1 fL — ABNORMAL LOW (ref 78.0–100.0)
Platelets: 320 10*3/uL (ref 150–400)
RBC: 4.23 MIL/uL (ref 3.87–5.11)
RDW: 17.4 % — ABNORMAL HIGH (ref 11.5–15.5)
WBC: 13 10*3/uL — AB (ref 4.0–10.5)

## 2015-03-17 LAB — RPR: RPR: NONREACTIVE

## 2015-03-17 NOTE — Lactation Note (Signed)
This note was copied from the chart of Cynthia Ophie Burrowes. Lactation Consultation Note  Patient Name: Cynthia Heath COBTV'M Date: 03/17/2015 Reason for consult: Follow-up assessment Baby at 33 hr of life and she requested LC help. Baby will latch easily but shallow. Showed mom how to use her nipple to force the baby's mouth open wider. Mom reported that this feeding felt much better. No skin break down noted on either nipple. Answered questions about the DEBP and gave mom #36 flanges. Reviewed the manual portion on the pump kit with mom because she was worried about not being able to pump at home. Mom was able to demonstrated manual expression. She was upset that milk was not flowing out in large volumes. Discussed baby belly size, breast changes, feeding frequency, and baby behavior. She is aware of OP services and support group. She will call as needed for bf help.   Maternal Data    Feeding Feeding Type: Breast Fed Nipple Type: Slow - flow Length of feed: 15 min  LATCH Score/Interventions Latch: Repeated attempts needed to sustain latch, nipple held in mouth throughout feeding, stimulation needed to elicit sucking reflex. Intervention(s): Skin to skin;Teach feeding cues Intervention(s): Adjust position;Assist with latch;Breast compression  Audible Swallowing: Spontaneous and intermittent Intervention(s): Skin to skin;Hand expression Intervention(s): Skin to skin;Hand expression;Alternate breast massage  Type of Nipple: Everted at rest and after stimulation  Comfort (Breast/Nipple): Filling, red/small blisters or bruises, mild/mod discomfort  Problem noted: Mild/Moderate discomfort Interventions (Mild/moderate discomfort): Hand expression  Hold (Positioning): Assistance needed to correctly position infant at breast and maintain latch. Intervention(s): Position options;Support Pillows  LATCH Score: 7  Lactation Tools Discussed/Used Pump Review: Setup, frequency, and  cleaning;Milk Storage Initiated by:: ES Date initiated:: 03/17/15   Consult Status Consult Status: Follow-up Date: 03/18/15 Follow-up type: In-patient    Denzil Hughes 03/17/2015, 7:10 PM

## 2015-03-17 NOTE — Progress Notes (Signed)
Cynthia SimmondsFranca R Heath   Subjective: Post Partum Day 1 Vaginal delivery, 1st degree laceration Patient up ad lib, denies syncope or dizziness. Reports consuming regular diet without issues and denies N/V No issues with urination and reports bleeding is appropriate  Feeding:  breast Contraceptive plan:   unsure  Objective: Temp:  [98.4 F (36.9 C)-99 F (37.2 C)] 98.4 F (36.9 C) (01/10 0520) Pulse Rate:  [82-100] 82 (01/10 0520) Resp:  [18] 18 (01/10 0520) BP: (104-126)/(63-78) 106/63 mmHg (01/10 0520) SpO2:  [100 %] 100 % (01/09 2003)  Physical Exam:  General: alert and cooperative Ext: WNL, no significant  edema. No evidence of DVT seen on physical exam. Breast: Soft filling Lungs: CTAB Heart RRR without murmur  Abdomen:  Soft, fundus firm, lochia scant, + bowel sounds, non distended, non tender Lochia: appropriate Uterine Fundus: firm Laceration: healing well    Recent Labs  03/16/15 1025 03/17/15 0510  HGB 10.5* 9.1*  HCT 32.4* 28.4*    Assessment S/P Vaginal Delivery-Day 1 Stable  Normal [employer] Breastfeeding   Plan: Continue current care Plan for discharge tomorrow Lactation support   Cynthia Heath, CNM, MSN 03/17/2015, 1:24 PM

## 2015-03-17 NOTE — Lactation Note (Signed)
This note was copied from the chart of Cynthia Heath. Lactation Consultation Note  Patient Name: Cynthia Heath [id number] Date: 03/17/2015 Reason for consult: Follow-up assessment  Baby Early term 37 5/7 day, 6-2.6 oz, moms feeding preference is to breast / formula.  Per mom really would like to give breast milk for supplementing instead of formula and has a desire  To add pumping to get milk in quicker. Per mom wondering if baby is getting any breast milk when latched.  Hand expressed prior to latch with 2 tiny drops of colostrum. Mom repeated demo of hand expressing and massage. LC reviewed basics of latching and importance of skin to skin feedings, hand expressing before latching, and don'r allow  The baby to nibble onto breast, Tickle upper lip , wait for wide open mouth, and latch with breast compressions until swallows. @ consult baby awake and hungry, LC assisted with latch and baby obtained depth and was still feeding after 10 mins with multiply  Swallows, increased with breast compressions. Per mom comfortable with football position.  Per mom expressed appreciation for breast feeding assistance and requested to be seen again this PM BY LC. LC discussed with [facility] RN caring for dyad to set up DEBP for post pumping.  LC mentioned to mom if she gets breast milk with hand expressing and/or pumping to save EBM and the MBURN or LC can assist to Feed baby EBM back via spoon , syringe.       Maternal Data Has patient been taught Hand Expression?: Yes  Feeding Feeding Type: Breast Fed Length of feed:  (stiill feeding at 10 mins with swallows )  LATCH Score/Interventions ( LC observed and assisted with this latch  Latch: Grasps breast easily, tongue down, lips flanged, rhythmical sucking. Intervention(s): Skin to skin;Waking techniques Intervention(s): Adjust position;Assist with latch;Breast massage;Breast compression  Audible Swallowing: Spontaneous and  intermittent  Type of Nipple: Everted at rest and after stimulation  Comfort (Breast/Nipple): Soft / non-tender     Hold (Positioning): Assistance needed to correctly position infant at breast and maintain latch. Intervention(s): Breastfeeding basics reviewed;Support Pillows;Position options;Skin to skin  LATCH Score: 9  Lactation Tools Discussed/Used Tools:  (mom is requesting to be set up with a DEBP , kit apt BS , [facility] RN aware to set up ) WIC Program: Yes   Consult Status Consult Status: Follow-up Date: 03/18/15 Follow-up type: In-patient    Myer Haff 03/17/2015, 2:25 PM

## 2015-03-18 MED ORDER — FERROUS SULFATE 325 (65 FE) MG PO TBEC: 325.0000 mg | DELAYED_RELEASE_TABLET | Freq: Two times a day (BID) | ORAL | Status: DC

## 2015-03-18 MED ORDER — SENNOSIDES-DOCUSATE SODIUM 8.6-50 MG PO TABS: 2.0000 | ORAL_TABLET | ORAL | Status: DC

## 2015-03-18 MED ORDER — OXYCODONE-ACETAMINOPHEN 5-325 MG PO TABS: 1.0000 | ORAL_TABLET | ORAL | Status: DC | PRN

## 2015-03-18 MED ORDER — IBUPROFEN 600 MG PO TABS: 600.0000 mg | ORAL_TABLET | Freq: Four times a day (QID) | ORAL | Status: DC

## 2015-03-18 NOTE — Lactation Note (Signed)
This note was copied from the chart of Cynthia Galen DaftFranca Ruddell. Lactation Consultation Note  6719w5d <6 lbs.  Mother states her nipples are still tender.  No trauma to nipples noted.  Discussed applying ebm, coconut or olive oil. Mother breasts are filling.  Mother is bf and occasionally supplementing w/ formula after. Mother asked how to get more breast tissue in baby's small mouth.  Helped her position to side lying. Baby latched easily.  Once baby latched demonstrated how to perform chin tug.  Sucks and swallows observed. Mother then tried to hold breast tissue back from baby's nose.  Provided education how baby's breathe while bf and that it can cause a shallow latch. Mother states she has improved comfort. Reviewed engorgement care and monitoring voids/stools.   Patient Name: Cynthia Heath ZOXWR'UToday's Date: 03/18/2015 Reason for consult: Follow-up assessment   Maternal Data    Feeding Feeding Type: Breast Fed Nipple Type: Slow - flow Length of feed: 30 min  LATCH Score/Interventions Latch: Grasps breast easily, tongue down, lips flanged, rhythmical sucking.  Audible Swallowing: Spontaneous and intermittent  Type of Nipple: Everted at rest and after stimulation  Comfort (Breast/Nipple): Filling, red/small blisters or bruises, mild/mod discomfort  Problem noted: Mild/Moderate discomfort Interventions (Mild/moderate discomfort): Hand expression  Hold (Positioning): Assistance needed to correctly position infant at breast and maintain latch.  LATCH Score: 8  Lactation Tools Discussed/Used     Consult Status Consult Status: Complete    Hardie PulleyBerkelhammer, Ruth Boschen 03/18/2015, 8:53 AM

## 2015-03-18 NOTE — Progress Notes (Signed)
Discharge education complete, scripts give, discharge instructions and follow up appointment discussed. Patient verbalized understanding.

## 2015-04-11 NOTE — Discharge Summary (Signed)
OB Discharge Summary  Patient Name: Cynthia Heath DOB: 06-20-1980 MRN: [id number]  Date of admission: 03/16/2015 Delivering MD: Gerrit Heck   Date of discharge: 04/11/2015  Admitting diagnosis: LABOR Intrauterine pregnancy: [redacted]w[redacted]d     Secondary diagnosis:Active Problems:   Active labor at term  Additional problems:HgB Electro: AF-No clinical significance.      Discharge diagnosis: Term Pregnancy Delivered                                                                     Post partum procedures:none  Augmentation: none  Complications: None  Hospital course:  Onset of Labor With Vaginal Delivery     35 y.o. yo [id number] at [redacted]w[redacted]d was admitted in Active Labor on 03/16/2015. Patient had an uncomplicated labor course as follows:  Membrane Rupture Time/Date: 6:30 AM ,03/16/2015   Intrapartum Procedures: Episiotomy: None [1]                                         Lacerations:  1st degree [2]  Patient had a delivery of a Viable infant. 03/16/2015  Information for the patient's newborn:  Domitila, Stetler [[id number]]  Delivery Method: Vaginal, Spontaneous Delivery (Filed from Delivery Summary)    Pateint had an uncomplicated postpartum course.  She is ambulating, tolerating a regular diet, passing flatus, and urinating well. Patient is discharged home in stable condition on 04/11/2015.    Physical exam  Filed Vitals:   03/16/15 2003 03/17/15 0520 03/17/15 1805 03/18/15 0629  BP: 104/78 106/63 105/69 103/68  Pulse: 82 82 81 79  Temp: 98.5 F (36.9 C) 98.4 F (36.9 C) 98.4 F (36.9 C) 97.9 F (36.6 C)  TempSrc: Oral  Oral Oral  Resp: 18 18 18 18   Height:      Weight:      SpO2: 100%      General: alert, cooperative and no distress Lochia: appropriate Uterine Fundus: firm Incision: Healing well with no significant drainage DVT Evaluation: No evidence of DVT seen on physical exam. Labs: Lab Results  Component Value Date   WBC 13.0* 03/17/2015   HGB 9.1*  03/17/2015   HCT 28.4* 03/17/2015   MCV 67.1* 03/17/2015   PLT 320 03/17/2015   CMP Latest Ref Rng 08/14/2014  Glucose 65 - 99 mg/dL 97  BUN 6 - 20 mg/dL 10  Creatinine 0.27 - 2.53 mg/dL 6.64  Sodium 403 - 474 mmol/L 134(L)  Potassium 3.5 - 5.1 mmol/L 3.7  Chloride 101 - 111 mmol/L 105  CO2 22 - 32 mmol/L 24  Calcium 8.9 - 10.3 mg/dL 2.5(Z)  Total Protein 6.5 - 8.1 g/dL 6.7  Total Bilirubin 0.3 - 1.2 mg/dL 0.5  Alkaline Phos 38 - 126 U/L 45  AST 15 - 41 U/L 13(L)  ALT 14 - 54 U/L 16    Discharge instruction: per After Visit Summary and "Baby and Me Booklet".  Medications: No current facility-administered medications for this encounter.  Current outpatient prescriptions:  .  Prenatal Vit-Fe Fumarate-FA (PRENATAL MULTIVITAMIN) TABS tablet, Take 1 tablet by mouth daily at 12 noon., Disp: , Rfl:  .  ferrous sulfate 325 (  65 FE) MG EC tablet, Take 1 tablet (325 mg total) by mouth 2 (two) times daily., Disp: 60 tablet, Rfl: 3 .  ibuprofen (ADVIL,MOTRIN) 600 MG tablet, Take 1 tablet (600 mg total) by mouth every 6 (six) hours., Disp: 30 tablet, Rfl: 0 .  oxyCODONE-acetaminophen (PERCOCET/ROXICET) 5-325 MG tablet, Take 1 tablet by mouth every 4 (four) hours as needed (for pain scale greater than or equal to 4 and less than 7)., Disp: 30 tablet, Rfl: 0 .  senna-docusate (SENOKOT-S) 8.6-50 MG tablet, Take 2 tablets by mouth daily., Disp: 30 tablet, Rfl: 1 After Visit Meds:    Medication List    STOP taking these medications        PRILOSEC PO      TAKE these medications        ferrous sulfate 325 (65 FE) MG EC tablet  Take 1 tablet (325 mg total) by mouth 2 (two) times daily.     ibuprofen 600 MG tablet  Commonly known as:  ADVIL,MOTRIN  Take 1 tablet (600 mg total) by mouth every 6 (six) hours.     oxyCODONE-acetaminophen 5-325 MG tablet  Commonly known as:  PERCOCET/ROXICET  Take 1 tablet by mouth every 4 (four) hours as needed (for pain scale greater than or equal to 4 and  less than 7).     prenatal multivitamin Tabs tablet  Take 1 tablet by mouth daily at 12 noon.     senna-docusate 8.6-50 MG tablet  Commonly known as:  Senokot-S  Take 2 tablets by mouth daily.        Diet: routine diet  Activity: Advance as tolerated. Pelvic rest for 6 weeks.   Outpatient follow up:6 weeks Follow up Appt:No future appointments. Follow up visit: No Follow-up on file.  Postpartum contraception: Undecided  Newborn Data: Live born female  Birth Weight: 6 lb 3.5 oz (2820 g) APGAR: 9, 9  Baby Feeding: Breast Disposition:home with mother  Late Entry 04/11/2015 Eryc Bodey, CNM      Postpartum Care After Vaginal Delivery  After you deliver your newborn (postpartum period), the usual stay in the hospital is 24 72 hours. If there were problems with your labor or delivery, or if you have other medical problems, you might be in the hospital longer.  While you are in the hospital, you will receive help and instructions on how to care for yourself and your newborn during the postpartum period.  While you are in the hospital:  Be sure to tell your nurses if you have pain or discomfort, as well as where you feel the pain and what makes the pain worse.  If you had an incision made near your vagina (episiotomy) or if you had some tearing during delivery, the nurses may put ice packs on your episiotomy or tear. The ice packs may help to reduce the pain and swelling.  If you are breastfeeding, you may feel uncomfortable contractions of your uterus for a couple of weeks. This is normal. The contractions help your uterus get back to normal size.  It is normal to have some bleeding after delivery.  For the first 1 3 days after delivery, the flow is red and the amount may be similar to a period.  It is common for the flow to start and stop.  In the first few days, you may pass some small clots. Let your nurses know if you begin to pass large clots or your flow  increases.  Do not  flush blood  clots down the toilet before having the nurse look at them.  During the next 3 10 days after delivery, your flow should become more watery and pink or brown-tinged in color.  Ten to fourteen days after delivery, your flow should be a small amount of yellowish-white discharge.  The amount of your flow will decrease over the first few weeks after delivery. Your flow may stop in 6 8 weeks. Most women have had their flow stop by 12 weeks after delivery.  You should change your sanitary pads frequently.  Wash your hands thoroughly with soap and water for at least 20 seconds after changing pads, using the toilet, or before holding or feeding your newborn.  You should feel like you need to empty your bladder within the first 6 8 hours after delivery.  In case you become weak, lightheaded, or faint, call your nurse before you get out of bed for the first time and before you take a shower for the first time.  Within the first few days after delivery, your breasts may begin to feel tender and full. This is called engorgement. Breast tenderness usually goes away within 48 72 hours after engorgement occurs. You may also notice milk leaking from your breasts. If you are not breastfeeding, do not stimulate your breasts. Breast stimulation can make your breasts produce more milk.  Spending as much time as possible with your newborn is very important. During this time, you and your newborn can feel close and get to know each other. Having your newborn stay in your room (rooming in) will help to strengthen the bond with your newborn. It will give you time to get to know your newborn and become comfortable caring for your newborn.  Your hormones change after delivery. Sometimes the hormone changes can temporarily cause you to feel sad or tearful. These feelings should not last more than a few days. If these feelings last longer than that, you should talk to your caregiver.  If  desired, talk to your caregiver about methods of family planning or contraception.  Talk to your caregiver about immunizations. Your caregiver may want you to have the following immunizations before leaving the hospital:  Tetanus, diphtheria, and pertussis (Tdap) or tetanus and diphtheria (Td) immunization. It is very important that you and your family (including grandparents) or others caring for your newborn are up-to-date with the Tdap or Td immunizations. The Tdap or Td immunization can help protect your newborn from getting ill.  Rubella immunization.  Varicella (chickenpox) immunization.  Influenza immunization. You should receive this annual immunization if you did not receive the immunization during your pregnancy. Document Released: 12/19/2006 Document Revised: 11/16/2011 Document Reviewed: 10/19/2011 [facility] Patient Information 2014 [place], [place].   Postpartum Depression and Baby Blues  The postpartum period begins right after the birth of a baby. During this time, there is often a great amount of joy and excitement. It is also a time of considerable changes in the life of the parent(s). Regardless of how many times a mother gives birth, each child brings new challenges and dynamics to the family. It is not unusual to have feelings of excitement accompanied by confusing shifts in moods, emotions, and thoughts. All mothers are at risk of developing postpartum depression or the "baby blues." These mood changes can occur right after giving birth, or they may occur many months after giving birth. The baby blues or postpartum depression can be mild or severe. Additionally, postpartum depression can resolve rather quickly, or it can  be a long-term condition. CAUSES Elevated hormones and their rapid decline are thought to be a main cause of postpartum depression and the baby blues. There are a number of hormones that radically change during and after pregnancy. Estrogen and progesterone  usually decrease immediately after delivering your baby. The level of thyroid hormone and various cortisol steroids also rapidly drop. Other factors that play a major role in these changes include major life events and genetics.  RISK FACTORS If you have any of the following risks for the baby blues or postpartum depression, know what symptoms to watch out for during the postpartum period. Risk factors that may increase the likelihood of getting the baby blues or postpartum depression include: 1. Havinga personal or family history of depression. 2. Having depression while being pregnant. 3. Having premenstrual or oral contraceptive-associated mood issues. 4. Having exceptional life stress. 5. Having marital conflict. 6. Lacking a social support network. 7. Having a baby with special needs. 8. Having health problems such as diabetes. SYMPTOMS Baby blues symptoms include:  Brief fluctuations in mood, such as going from extreme happiness to sadness.  Decreased concentration.  Difficulty sleeping.  Crying spells, tearfulness.  Irritability.  Anxiety. Postpartum depression symptoms typically begin within the first month after giving birth. These symptoms include:  Difficulty sleeping or excessive sleepiness.  Marked weight loss.  Agitation.  Feelings of worthlessness.  Lack of interest in activity or food. Postpartum psychosis is a very concerning condition and can be dangerous. Fortunately, it is rare. Displaying any of the following symptoms is cause for immediate medical attention. Postpartum psychosis symptoms include:  Hallucinations and delusions.  Bizarre or disorganized behavior.  Confusion or disorientation. DIAGNOSIS  A diagnosis is made by an evaluation of your symptoms. There are no medical or lab tests that lead to a diagnosis, but there are various questionnaires that a caregiver may use to identify those with the baby blues, postpartum depression, or psychosis.  Often times, a screening tool called the [place] Postnatal Depression Scale is used to diagnose depression in the postpartum period.  TREATMENT The baby blues usually goes away on its own in 1 to 2 weeks. Social support is often all that is needed. You should be encouraged to get adequate sleep and rest. Occasionally, you may be given medicines to help you sleep.  Postpartum depression requires treatment as it can last several months or longer if it is not treated. Treatment may include individual or group therapy, medicine, or both to address any social, physiological, and psychological factors that may play a role in the depression. Regular exercise, a healthy diet, rest, and social support may also be strongly recommended.  Postpartum psychosis is more serious and needs treatment right away. Hospitalization is often needed. HOME CARE INSTRUCTIONS  Get as much rest as you can. Nap when the baby sleeps.  Exercise regularly. Some women find yoga and walking to be beneficial.  Eat a balanced and nourishing diet.  Do little things that you enjoy. Have a cup of tea, take a bubble bath, read your favorite magazine, or listen to your favorite music.  Avoid alcohol.  Ask for help with household chores, cooking, grocery shopping, or running errands as needed. Do not try to do everything.  Talk to people close to you about how you are feeling. Get support from your partner, family members, friends, or other new moms.  Try to stay positive in how you think. Think about the things you are grateful for.  Do not  spend a lot of time alone.  Only take medicine as directed by your caregiver.  Keep all your postpartum appointments.  Let your caregiver know if you have any concerns. SEEK MEDICAL CARE IF: You are having a reaction or problems with your medicine. SEEK IMMEDIATE MEDICAL CARE IF:  You have suicidal feelings.  You feel you may harm the baby or someone else. Document Released:  11/26/2003 Document Revised: 05/16/2011 Document Reviewed: 12/28/2010 [facility] Patient Information 2014 [place], [place].     Breastfeeding Deciding to breastfeed is one of the best choices you can make for you and your baby. A change in hormones during pregnancy causes your breast tissue to grow and increases the number and size of your milk ducts. These hormones also allow proteins, sugars, and fats from your blood supply to make breast milk in your milk-producing glands. Hormones prevent breast milk from being released before your baby is born as well as prompt milk flow after birth. Once breastfeeding has begun, thoughts of your baby, as well as his or her sucking or crying, can stimulate the release of milk from your milk-producing glands.  BENEFITS OF BREASTFEEDING For Your Baby  Your first milk (colostrum) helps your baby's digestive system function better.   There are antibodies in your milk that help your baby fight off infections.   Your baby has a lower incidence of asthma, allergies, and sudden infant death syndrome.   The nutrients in breast milk are better for your baby than infant formulas and are designed uniquely for your baby's needs.   Breast milk improves your baby's brain development.   Your baby is less likely to develop other conditions, such as childhood obesity, asthma, or type 2 diabetes mellitus.  For You   Breastfeeding helps to create a very special bond between you and your baby.   Breastfeeding is convenient. Breast milk is always available at the correct temperature and costs nothing.   Breastfeeding helps to burn calories and helps you lose the weight gained during pregnancy.   Breastfeeding makes your uterus contract to its prepregnancy size faster and slows bleeding (lochia) after you give birth.   Breastfeeding helps to lower your risk of developing type 2 diabetes mellitus, osteoporosis, and breast or ovarian cancer later in life. SIGNS  THAT YOUR BABY IS HUNGRY Early Signs of Hunger  Increased alertness or activity.  Stretching.  Movement of the head from side to side.  Movement of the head and opening of the mouth when the corner of the mouth or cheek is stroked (rooting).  Increased sucking sounds, smacking lips, cooing, sighing, or squeaking.  Hand-to-mouth movements.  Increased sucking of fingers or hands. Late Signs of Hunger  Fussing.  Intermittent crying. Extreme Signs of Hunger Signs of extreme hunger will require calming and consoling before your baby will be able to breastfeed successfully. Do not wait for the following signs of extreme hunger to occur before you initiate breastfeeding:   Restlessness.  A loud, strong cry.   Screaming.   BREASTFEEDING BASICS Breastfeeding Initiation  Find a comfortable place to sit or lie down, with your neck and back well supported.  Place a pillow or rolled up blanket under your baby to bring him or her to the level of your breast (if you are seated). Nursing pillows are specially designed to help support your arms and your baby while you breastfeed.  Make sure that your baby's abdomen is facing your abdomen.   Gently massage your breast. With your  fingertips, massage from your chest wall toward your nipple in a circular motion. This encourages milk flow. You may need to continue this action during the feeding if your milk flows slowly.  Support your breast with 4 fingers underneath and your thumb above your nipple. Make sure your fingers are well away from your nipple and your baby's mouth.   Stroke your baby's lips gently with your finger or nipple.   When your baby's mouth is open wide enough, quickly bring your baby to your breast, placing your entire nipple and as much of the colored area around your nipple (areola) as possible into your baby's mouth.   More areola should be visible above your baby's upper lip than below the lower lip.   Your  baby's tongue should be between his or her lower gum and your breast.   Ensure that your baby's mouth is correctly positioned around your nipple (latched). Your baby's lips should create a seal on your breast and be turned out (everted).  It is common for your baby to suck about 2-3 minutes in order to start the flow of breast milk. Latching Teaching your baby how to latch on to your breast properly is very important. An improper latch can cause nipple pain and decreased milk supply for you and poor weight gain in your baby. Also, if your baby is not latched onto your nipple properly, he or she may swallow some air during feeding. This can make your baby fussy. Burping your baby when you switch breasts during the feeding can help to get rid of the air. However, teaching your baby to latch on properly is still the best way to prevent fussiness from swallowing air while breastfeeding. Signs that your baby has successfully latched on to your nipple:    Silent tugging or silent sucking, without causing you pain.   Swallowing heard between every 3-4 sucks.    Muscle movement above and in front of his or her ears while sucking.  Signs that your baby has not successfully latched on to nipple:   Sucking sounds or smacking sounds from your baby while breastfeeding.  Nipple pain. If you think your baby has not latched on correctly, slip your finger into the corner of your baby's mouth to break the suction and place it between your baby's gums. Attempt breastfeeding initiation again. Signs of Successful Breastfeeding Signs from your baby:   A gradual decrease in the number of sucks or complete cessation of sucking.   Falling asleep.   Relaxation of his or her body.   Retention of a small amount of milk in his or her mouth.   Letting go of your breast by himself or herself. Signs from you:  Breasts that have increased in firmness, weight, and size 1-3 hours after feeding.   Breasts  that are softer immediately after breastfeeding.  Increased milk volume, as well as a change in milk consistency and color by the fifth day of breastfeeding.   Nipples that are not sore, cracked, or bleeding. Signs That Your Pecola Leisure is Getting Enough Milk  Wetting at least 3 diapers in a 24-hour period. The urine should be clear and pale yellow by age 1 days.  At least 3 stools in a 24-hour period by age 1 days. The stool should be soft and yellow.  At least 3 stools in a 24-hour period by age 71 days. The stool should be seedy and yellow.  No loss of weight greater than 10% of birth  weight during the first 53 days of age.  Average weight gain of 4-7 ounces (113-198 g) per week after age 147 days.  Consistent daily weight gain by age 14 days, without weight loss after the age of 2 weeks. After a feeding, your baby may spit up a small amount. This is common. BREASTFEEDING FREQUENCY AND DURATION Frequent feeding will help you make more milk and can prevent sore nipples and breast engorgement. Breastfeed when you feel the need to reduce the fullness of your breasts or when your baby shows signs of hunger. This is called "breastfeeding on demand." Avoid introducing a pacifier to your baby while you are working to establish breastfeeding (the first 4-6 weeks after your baby is born). After this time you may choose to use a pacifier. Research has shown that pacifier use during the first year of a baby's life decreases the risk of sudden infant death syndrome (SIDS). Allow your baby to feed on each breast as long as he or she wants. Breastfeed until your baby is finished feeding. When your baby unlatches or falls asleep while feeding from the first breast, offer the second breast. Because newborns are often sleepy in the first few weeks of life, you may need to awaken your baby to get him or her to feed. Breastfeeding times will vary from baby to baby. However, the following rules can serve as a guide to  help you ensure that your baby is properly fed:  Newborns (babies 5 weeks of age or younger) may breastfeed every 1-3 hours.  Newborns should not go longer than 3 hours during the day or 5 hours during the night without breastfeeding.  You should breastfeed your baby a minimum of 8 times in a 24-hour period until you begin to introduce solid foods to your baby at around 3 months of age. BREAST MILK PUMPING Pumping and storing breast milk allows you to ensure that your baby is exclusively fed your breast milk, even at times when you are unable to breastfeed. This is especially important if you are going back to work while you are still breastfeeding or when you are not able to be present during feedings. Your lactation consultant can give you guidelines on how long it is safe to store breast milk.  A breast pump is a machine that allows you to pump milk from your breast into a sterile bottle. The pumped breast milk can then be stored in a refrigerator or freezer. Some breast pumps are operated by hand, while others use electricity. Ask your lactation consultant which type will work best for you. Breast pumps can be purchased, but some hospitals and breastfeeding support groups lease breast pumps on a monthly basis. A lactation consultant can teach you how to hand express breast milk, if you prefer not to use a pump.  CARING FOR YOUR BREASTS WHILE YOU BREASTFEED Nipples can become dry, cracked, and sore while breastfeeding. The following recommendations can help keep your breasts moisturized and healthy:  Avoid using soap on your nipples.   Wear a supportive bra. Although not required, special nursing bras and tank tops are designed to allow access to your breasts for breastfeeding without taking off your entire bra or top. Avoid wearing underwire-style bras or extremely tight bras.  Air dry your nipples for 3-53minutes after each feeding.   Use only cotton bra pads to absorb leaked breast milk.  Leaking of breast milk between feedings is normal.   Use lanolin on your nipples after breastfeeding.  Lanolin helps to maintain your skin's normal moisture barrier. If you use pure lanolin, you do not need to wash it off before feeding your baby again. Pure lanolin is not toxic to your baby. You may also hand express a few drops of breast milk and gently massage that milk into your nipples and allow the milk to air dry. In the first few weeks after giving birth, some women experience extremely full breasts (engorgement). Engorgement can make your breasts feel heavy, warm, and tender to the touch. Engorgement peaks within 3-5 days after you give birth. The following recommendations can help ease engorgement:  Completely empty your breasts while breastfeeding or pumping. You may want to start by applying warm, moist heat (in the shower or with warm water-soaked hand towels) just before feeding or pumping. This increases circulation and helps the milk flow. If your baby does not completely empty your breasts while breastfeeding, pump any extra milk after he or she is finished.  Wear a snug bra (nursing or regular) or tank top for 1-2 days to signal your body to slightly decrease milk production.  Apply ice packs to your breasts, unless this is too uncomfortable for you.  Make sure that your baby is latched on and positioned properly while breastfeeding. If engorgement persists after 48 hours of following these recommendations, contact your health care provider or a Advertising copywriter. OVERALL HEALTH CARE RECOMMENDATIONS WHILE BREASTFEEDING  Eat healthy foods. Alternate between meals and snacks, eating 3 of each per day. Because what you eat affects your breast milk, some of the foods may make your baby more irritable than usual. Avoid eating these foods if you are sure that they are negatively affecting your baby.  Drink milk, fruit juice, and water to satisfy your thirst (about 10 glasses a day).    Rest often, relax, and continue to take your prenatal vitamins to prevent fatigue, stress, and anemia.  Continue breast self-awareness checks.  Avoid chewing and smoking tobacco.  Avoid alcohol and drug use. Some medicines that may be harmful to your baby can pass through breast milk. It is important to ask your health care provider before taking any medicine, including all over-the-counter and prescription medicine as well as vitamin and herbal supplements. It is possible to become pregnant while breastfeeding. If birth control is desired, ask your health care provider about options that will be safe for your baby. SEEK MEDICAL CARE IF:   You feel like you want to stop breastfeeding or have become frustrated with breastfeeding.  You have painful breasts or nipples.  Your nipples are cracked or bleeding.  Your breasts are red, tender, or warm.  You have a swollen area on either breast.  You have a fever or chills.  You have nausea or vomiting.  You have drainage other than breast milk from your nipples.  Your breasts do not become full before feedings by the fifth day after you give birth.  You feel sad and depressed.  Your baby is too sleepy to eat well.  Your baby is having trouble sleeping.   Your baby is wetting less than 3 diapers in a 24-hour period.  Your baby has less than 3 stools in a 24-hour period.  Your baby's skin or the white part of his or her eyes becomes yellow.   Your baby is not gaining weight by 57 days of age. SEEK IMMEDIATE MEDICAL CARE IF:   Your baby is overly tired (lethargic) and does not want to wake up  and feed.  Your baby develops an unexplained fever. Document Released: 02/21/2005 Document Revised: 02/26/2013 Document Reviewed: 08/15/2012 [facility] Patient Information 2015 [place], [place]. This information is not intended to replace advice given to you by your health care provider. Make sure you discuss any questions you have with  your health care provider.

## 2016-02-05 ENCOUNTER — Encounter (HOSPITAL_COMMUNITY): Payer: Self-pay

## 2016-02-05 ENCOUNTER — Emergency Department (HOSPITAL_COMMUNITY)
Admission: EM | Admit: 2016-02-05 | Discharge: 2016-02-05 | Disposition: A | Discharge status: Home / Self Care | Payer: Medicaid Other | Attending: Emergency Medicine | Admitting: Emergency Medicine

## 2016-02-05 ENCOUNTER — Emergency Department (HOSPITAL_COMMUNITY): Payer: Medicaid Other

## 2016-02-05 DIAGNOSIS — M545 Low back pain, unspecified: Secondary | ICD-10-CM

## 2016-02-05 DIAGNOSIS — R109 Unspecified abdominal pain: Secondary | ICD-10-CM | POA: Insufficient documentation

## 2016-02-05 DIAGNOSIS — Z79899 Other long term (current) drug therapy: Secondary | ICD-10-CM | POA: Diagnosis not present

## 2016-02-05 LAB — I-STAT CHEM 8, ED
BUN: 14 mg/dL (ref 6–20)
Calcium, Ion: 1.2 mmol/L (ref 1.15–1.40)
Chloride: 102 mmol/L (ref 101–111)
Creatinine, Ser: 1 mg/dL (ref 0.44–1.00)
Glucose, Bld: 115 mg/dL — ABNORMAL HIGH (ref 65–99)
HCT: 39 % (ref 36.0–46.0)
Hemoglobin: 13.3 g/dL (ref 12.0–15.0)
Potassium: 3.8 mmol/L (ref 3.5–5.1)
Sodium: 142 mmol/L (ref 135–145)
TCO2: 28 mmol/L (ref 0–100)

## 2016-02-05 LAB — URINALYSIS, ROUTINE W REFLEX MICROSCOPIC
Bilirubin Urine: NEGATIVE
Glucose, UA: NEGATIVE mg/dL
Hgb urine dipstick: NEGATIVE
Ketones, ur: NEGATIVE mg/dL
LEUKOCYTES UA: NEGATIVE
NITRITE: NEGATIVE
PROTEIN: NEGATIVE mg/dL
Specific Gravity, Urine: 1.02 (ref 1.005–1.030)
pH: 5.5 (ref 5.0–8.0)

## 2016-02-05 LAB — POC URINE PREG, ED: PREG TEST UR: NEGATIVE

## 2016-02-05 MED ORDER — CYCLOBENZAPRINE HCL 10 MG PO TABS
10.0000 mg | ORAL_TABLET | Freq: Once | ORAL | Status: AC
  Administered 2016-02-05: 10 mg via ORAL

## 2016-02-05 MED ORDER — IBUPROFEN 600 MG PO TABS: 600.0000 mg | ORAL_TABLET | Freq: Four times a day (QID) | ORAL | 0 refills | Status: DC | PRN

## 2016-02-05 MED ORDER — CYCLOBENZAPRINE HCL 10 MG PO TABS: 10.0000 mg | ORAL_TABLET | Freq: Two times a day (BID) | ORAL | 0 refills | Status: DC | PRN

## 2016-02-05 MED ORDER — CYCLOBENZAPRINE HCL 10 MG PO TABS
ORAL_TABLET | ORAL | Status: AC
  Filled 2016-02-05: qty 1

## 2016-02-05 NOTE — ED Notes (Signed)
Patient taken to CT.

## 2016-02-05 NOTE — ED Notes (Signed)
Patient Alert and oriented X4. Stable and ambulatory. Patient verbalized understanding of the discharge instructions.  Patient belongings were taken by the patient.  

## 2016-02-05 NOTE — ED Provider Notes (Signed)
[facility]-EMERGENCY DEPT Provider Note   CSN: [id number] Arrival date & time: 02/05/16  1815  By signing my name below, I, Doreatha MartinEva Mathews, attest that this documentation has been prepared under the direction and in the presence of  [facility]. Damian LeavellNeese, NP. Electronically Signed: Doreatha MartinEva Mathews, ED Scribe. 02/05/16. 7:12 PM.    History   Chief Complaint Chief Complaint  Patient presents with  . Back Pain    HPI Cynthia Heath is a 35 y.o. female who presents to the Emergency Department complaining of moderate, worsening right flank pain with radiation to the lower abdomen and right shoulder onset 8 days ago. She also reports bilateral hip discomfort (R>L). [facility], had urine dip that was positive for blood and was referred here for further evaluation d/t concern for kidney or gallstones. Pt denies recent trauma, injury, heavy lifting, falls, twisting, bending. Pt is ambulatory with minimal difficulty. Pt states her pain is worsened with ambulation and in certain positions. She had been taking ibuprofen, ASA, acetaminophen without significant relief of pain. No h/o renal calculi. She denies vaginal bleeding, nausea, vomiting.  Pt has an Implanon and is not concerned for pregnancy.    The history is provided by the patient. No language interpreter was used.  Flank Pain  This is a new problem. The current episode started more than 1 week ago. The problem occurs constantly. The problem has been gradually worsening. Associated symptoms include abdominal pain. The symptoms are aggravated by walking. Nothing relieves the symptoms. She has tried ASA and acetaminophen for the symptoms. The treatment provided no relief.    Past Medical History:  Diagnosis Date  . Preterm labor     Patient Active Problem List   Diagnosis Date Noted  . Active labor at term 03/16/2015    Past Surgical History:  Procedure Laterality Date  . NO PAST SURGERIES      OB History    Gravida Para Term  Preterm AB Living   3 3 1 2   3    SAB TAB Ectopic Multiple Live Births         0 3       Home Medications    Prior to Admission medications   Medication Sig Start Date End Date Taking? Authorizing Provider  cyclobenzaprine (FLEXERIL) 10 MG tablet Take 1 tablet (10 mg total) by mouth 2 (two) times daily as needed for muscle spasms. 02/05/16   Evianna Chandran Orlene OchM Teran Daughenbaugh, NP  ferrous sulfate 325 (65 FE) MG EC tablet Take 1 tablet (325 mg total) by mouth 2 (two) times daily. 03/18/15 03/17/16  Venus Standard, CNM  ibuprofen (ADVIL,MOTRIN) 600 MG tablet Take 1 tablet (600 mg total) by mouth every 6 (six) hours as needed. 02/05/16   Deja Pisarski Orlene OchM Jeremie Abdelaziz, NP  oxyCODONE-acetaminophen (PERCOCET/ROXICET) 5-325 MG tablet Take 1 tablet by mouth every 4 (four) hours as needed (for pain scale greater than or equal to 4 and less than 7). 03/18/15   Venus Standard, CNM  Prenatal Vit-Fe Fumarate-FA (PRENATAL MULTIVITAMIN) TABS tablet Take 1 tablet by mouth daily at 12 noon.    Historical Provider, MD  senna-docusate (SENOKOT-S) 8.6-50 MG tablet Take 2 tablets by mouth daily. 03/18/15   Venus Standard, CNM    Family History Family History  Problem Relation Age of Onset  . Diabetes Mother     Social History Social History  Substance Use Topics  . Smoking status: Never Smoker  . Smokeless tobacco: Never Used  .  Alcohol use No     Allergies   Patient has no known allergies.   Review of Systems Review of Systems  Gastrointestinal: Positive for abdominal pain. Negative for nausea and vomiting.  Genitourinary: Positive for flank pain. Negative for vaginal bleeding.  Musculoskeletal: Positive for myalgias.  All other systems reviewed and are negative.    Physical Exam Updated Vital Signs BP 113/72 (BP Location: Right Arm)   Pulse 82   Temp 97.9 F (36.6 C) (Oral)   Resp 18   Ht 5\' 6"  (1.676 m)   Wt 77 kg   LMP 01/20/2016   SpO2 99%   Breastfeeding? No   BMI 27.40 kg/m   Physical Exam  Constitutional: She  is oriented to person, place, and time. She appears well-developed and well-nourished. No distress.  HENT:  Head: Normocephalic.  Mouth/Throat: Uvula is midline, oropharynx is clear and moist and mucous membranes are normal.  Eyes: Conjunctivae are normal.  Neck: Normal range of motion. Neck supple.  Cardiovascular: Normal rate and regular rhythm.   Pulmonary/Chest: Effort normal and breath sounds normal. No respiratory distress. She has no wheezes. She has no rales.  Abdominal: Soft. She exhibits no distension. There is no tenderness.  Musculoskeletal: Normal range of motion. She exhibits tenderness. She exhibits no deformity.       Lumbar back: She exhibits spasm. She exhibits normal pulse. Decreased range of motion: due to pain. Tenderness: right.  Right flank pain.   Lymphadenopathy:    She has no cervical adenopathy.  Neurological: She is alert and oriented to person, place, and time.  Skin: Skin is warm and dry.  Psychiatric: She has a normal mood and affect. Her behavior is normal.  Nursing note and vitals reviewed.    ED Treatments / Results   DIAGNOSTIC STUDIES: Oxygen Saturation is 100% on RA, normal by my interpretation.    COORDINATION OF CARE: 7:09 PM Discussed treatment plan with pt at bedside which includes UA and pt agreed to plan.    Labs (all labs ordered are listed, but only abnormal results are displayed) Labs Reviewed  I-STAT CHEM 8, ED - Abnormal; Notable for the following:       Result Value   Glucose, Bld 115 (*)    All other components within normal limits  URINALYSIS, ROUTINE W REFLEX MICROSCOPIC (NOT AT [facility])  POC URINE PREG, ED    Radiology No results found.  Procedures Procedures (including critical care time)  Medications Ordered in ED Medications  cyclobenzaprine (FLEXERIL) 10 MG tablet (not administered)  cyclobenzaprine (FLEXERIL) tablet 10 mg (10 mg Oral Given 02/05/16 2227)     Initial Impression / Assessment and Plan / ED Course   I have reviewed the triage vital signs and the nursing notes.  Pertinent labs & imaging results that were available during my care of the patient were reviewed by me and considered in my medical decision making (see chart for details).  Clinical Course    35 y.o. female with right lower back pain that radiates to the right buttock stable for d/c without neuro deficits. Will treat for muscle spasm and she will f/u with her PCP or return here for worsening symptoms.   Final Clinical Impressions(s) / ED Diagnoses   Final diagnoses:  Acute right-sided low back pain without sciatica    New Prescriptions Discharge Medication List as of 02/05/2016 10:18 PM    START taking these medications   Details  cyclobenzaprine (FLEXERIL) 10 MG tablet Take 1 tablet (  10 mg total) by mouth 2 (two) times daily as needed for muscle spasms., Starting Fri 02/05/2016, Print       I personally performed the services described in this documentation, which was scribed in my presence. The recorded information has been reviewed and is accurate.    [address] [place], NP 02/10/16 2344    Lorre Nick, MD 02/11/16 [phone]

## 2016-02-05 NOTE — ED Notes (Signed)
Patient is A&Ox4 at this time.  Patient in no signs of distress.  Please see providers note for complete history and physical exam.  

## 2016-02-05 NOTE — Discharge Instructions (Signed)
Do not take the muscle relaxant while driving as it will make you sleepy. Follow up with your doctor. Return as needed for worsening symptoms

## 2016-02-05 NOTE — ED Triage Notes (Signed)
Pt. Having lower back pain since last Thursday.  She denies any injuries.  She reports having the pain into her hips.  She also having rt. Leg tingling.  She denies any urinary symptoms.  She denies any n/v/d  She is eating in Triage.  She reports having a good appetite.  She was seen at urgent care and they did an urine it was negative.  Movement increase the pain. Skin iis warm and dry.. No acute distress in triage.

## 2017-05-08 ENCOUNTER — Emergency Department (HOSPITAL_COMMUNITY): Payer: Self-pay

## 2017-05-08 ENCOUNTER — Other Ambulatory Visit: Payer: Self-pay

## 2017-05-08 ENCOUNTER — Emergency Department (HOSPITAL_COMMUNITY)
Admission: EM | Admit: 2017-05-08 | Discharge: 2017-05-08 | Disposition: A | Discharge status: Home / Self Care | Payer: Self-pay | Attending: Emergency Medicine | Admitting: Emergency Medicine

## 2017-05-08 ENCOUNTER — Encounter (HOSPITAL_COMMUNITY): Payer: Self-pay | Admitting: Emergency Medicine

## 2017-05-08 DIAGNOSIS — Y929 Unspecified place or not applicable: Secondary | ICD-10-CM | POA: Insufficient documentation

## 2017-05-08 DIAGNOSIS — W19XXXA Unspecified fall, initial encounter: Secondary | ICD-10-CM

## 2017-05-08 DIAGNOSIS — Y999 Unspecified external cause status: Secondary | ICD-10-CM | POA: Insufficient documentation

## 2017-05-08 DIAGNOSIS — Y9389 Activity, other specified: Secondary | ICD-10-CM | POA: Insufficient documentation

## 2017-05-08 DIAGNOSIS — S39012A Strain of muscle, fascia and tendon of lower back, initial encounter: Secondary | ICD-10-CM | POA: Insufficient documentation

## 2017-05-08 DIAGNOSIS — S0990XA Unspecified injury of head, initial encounter: Secondary | ICD-10-CM

## 2017-05-08 DIAGNOSIS — S0083XA Contusion of other part of head, initial encounter: Secondary | ICD-10-CM | POA: Insufficient documentation

## 2017-05-08 LAB — CBC
HCT: 34.2 % — ABNORMAL LOW (ref 36.0–46.0)
Hemoglobin: 11.1 g/dL — ABNORMAL LOW (ref 12.0–15.0)
MCH: 22.7 pg — ABNORMAL LOW (ref 26.0–34.0)
MCHC: 32.5 g/dL (ref 30.0–36.0)
MCV: 70.1 fL — AB (ref 78.0–100.0)
Platelets: 325 10*3/uL (ref 150–400)
RBC: 4.88 MIL/uL (ref 3.87–5.11)
RDW: 17.3 % — AB (ref 11.5–15.5)
WBC: 10.6 10*3/uL — ABNORMAL HIGH (ref 4.0–10.5)

## 2017-05-08 LAB — BASIC METABOLIC PANEL
Anion gap: 10 (ref 5–15)
BUN: 16 mg/dL (ref 6–20)
CALCIUM: 9.2 mg/dL (ref 8.9–10.3)
CHLORIDE: 105 mmol/L (ref 101–111)
CO2: 24 mmol/L (ref 22–32)
Creatinine, Ser: 0.81 mg/dL (ref 0.44–1.00)
GFR calc non Af Amer: >60 mL/min (ref >60)
GLUCOSE: 142 mg/dL — AB (ref 65–99)
Potassium: 3.6 mmol/L (ref 3.5–5.1)
Sodium: 139 mmol/L (ref 135–145)

## 2017-05-08 LAB — I-STAT BETA HCG BLOOD, ED (MC, WL, AP ONLY): I-stat hCG, quantitative: <5 m[IU]/mL (ref <5)

## 2017-05-08 LAB — I-STAT TROPONIN, ED: TROPONIN I, POC: 0 ng/mL (ref 0.00–0.08)

## 2017-05-08 MED ORDER — MORPHINE SULFATE (PF) 4 MG/ML IV SOLN
4.0000 mg | Freq: Once | INTRAVENOUS | Status: AC
  Administered 2017-05-08: 4 mg via INTRAVENOUS
  Filled 2017-05-08: qty 1

## 2017-05-08 MED ORDER — KETOROLAC TROMETHAMINE 30 MG/ML IJ SOLN
15.0000 mg | Freq: Once | INTRAMUSCULAR | Status: AC
  Administered 2017-05-08: 15 mg via INTRAVENOUS
  Filled 2017-05-08: qty 1

## 2017-05-08 MED ORDER — HYDROCODONE-ACETAMINOPHEN 5-325 MG PO TABS: 1.0000 | ORAL_TABLET | ORAL | 0 refills | Status: DC | PRN

## 2017-05-08 MED ORDER — ONDANSETRON HCL 4 MG/2ML IJ SOLN
4.0000 mg | Freq: Once | INTRAMUSCULAR | Status: AC
  Administered 2017-05-08: 4 mg via INTRAVENOUS
  Filled 2017-05-08: qty 2

## 2017-05-08 MED ORDER — IBUPROFEN 800 MG PO TABS: 800.0000 mg | ORAL_TABLET | Freq: Three times a day (TID) | ORAL | 0 refills | Status: DC | PRN
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
## 2020-10-01 ENCOUNTER — Emergency Department (HOSPITAL_COMMUNITY): Payer: Self-pay

## 2020-10-01 ENCOUNTER — Encounter (HOSPITAL_COMMUNITY): Payer: Self-pay

## 2020-10-01 ENCOUNTER — Emergency Department (HOSPITAL_COMMUNITY)
Admission: EM | Admit: 2020-10-01 | Discharge: 2020-10-01 | Disposition: A | Discharge status: Home / Self Care | Payer: Self-pay | Attending: Emergency Medicine | Admitting: Emergency Medicine

## 2020-10-01 DIAGNOSIS — R1010 Upper abdominal pain, unspecified: Secondary | ICD-10-CM | POA: Insufficient documentation

## 2020-10-01 DIAGNOSIS — Z20822 Contact with and (suspected) exposure to covid-19: Secondary | ICD-10-CM | POA: Insufficient documentation

## 2020-10-01 DIAGNOSIS — R0789 Other chest pain: Secondary | ICD-10-CM

## 2020-10-01 DIAGNOSIS — R61 Generalized hyperhidrosis: Secondary | ICD-10-CM | POA: Insufficient documentation

## 2020-10-01 DIAGNOSIS — R079 Chest pain, unspecified: Secondary | ICD-10-CM | POA: Insufficient documentation

## 2020-10-01 DIAGNOSIS — N9489 Other specified conditions associated with female genital organs and menstrual cycle: Secondary | ICD-10-CM | POA: Diagnosis not present

## 2020-10-01 DIAGNOSIS — R202 Paresthesia of skin: Secondary | ICD-10-CM | POA: Diagnosis not present

## 2020-10-01 DIAGNOSIS — R0682 Tachypnea, not elsewhere classified: Secondary | ICD-10-CM | POA: Insufficient documentation

## 2020-10-01 LAB — COMPREHENSIVE METABOLIC PANEL
ALT: 18 U/L (ref 0–44)
AST: 20 U/L (ref 15–41)
Albumin: 3.8 g/dL (ref 3.5–5.0)
Alkaline Phosphatase: 54 U/L (ref 38–126)
Anion gap: 9 (ref 5–15)
BUN: 14 mg/dL (ref 6–20)
CO2: 27 mmol/L (ref 22–32)
Calcium: 8.9 mg/dL (ref 8.9–10.3)
Chloride: 102 mmol/L (ref 98–111)
Creatinine, Ser: 0.91 mg/dL (ref 0.44–1.00)
GFR, Estimated: >60 mL/min (ref >60)
Glucose, Bld: 114 mg/dL — ABNORMAL HIGH (ref 70–99)
Potassium: 3.6 mmol/L (ref 3.5–5.1)
Sodium: 138 mmol/L (ref 135–145)
Total Bilirubin: 0.6 mg/dL (ref 0.3–1.2)
Total Protein: 7.3 g/dL (ref 6.5–8.1)

## 2020-10-01 LAB — RESP PANEL BY RT-PCR (FLU A&B, COVID) ARPGX2
Influenza A by PCR: NEGATIVE
Influenza B by PCR: NEGATIVE
SARS Coronavirus 2 by RT PCR: NEGATIVE

## 2020-10-01 LAB — CBC WITH DIFFERENTIAL/PLATELET
Abs Immature Granulocytes: 0.01 10*3/uL (ref 0.00–0.07)
Basophils Absolute: 0.1 10*3/uL (ref 0.0–0.1)
Basophils Relative: 1 %
Eosinophils Absolute: 0.1 10*3/uL (ref 0.0–0.5)
Eosinophils Relative: 2 %
HCT: 37.2 % (ref 36.0–46.0)
Hemoglobin: 11.7 g/dL — ABNORMAL LOW (ref 12.0–15.0)
Immature Granulocytes: 0 %
Lymphocytes Relative: 43 %
Lymphs Abs: 3.1 10*3/uL (ref 0.7–4.0)
MCH: 23.4 pg — ABNORMAL LOW (ref 26.0–34.0)
MCHC: 31.5 g/dL (ref 30.0–36.0)
MCV: 74.3 fL — ABNORMAL LOW (ref 80.0–100.0)
Monocytes Absolute: 0.7 10*3/uL (ref 0.1–1.0)
Monocytes Relative: 10 %
Neutro Abs: 3.1 10*3/uL (ref 1.7–7.7)
Neutrophils Relative %: 44 %
Platelets: 309 10*3/uL (ref 150–400)
RBC: 5.01 MIL/uL (ref 3.87–5.11)
RDW: 18.5 % — ABNORMAL HIGH (ref 11.5–15.5)
WBC: 7.1 10*3/uL (ref 4.0–10.5)
nRBC: 0 % (ref 0.0–0.2)

## 2020-10-01 LAB — I-STAT BETA HCG BLOOD, ED (MC, WL, AP ONLY): I-stat hCG, quantitative: <5 m[IU]/mL (ref <5)

## 2020-10-01 LAB — TROPONIN I (HIGH SENSITIVITY)
Troponin I (High Sensitivity): <2 ng/L (ref <18)
Troponin I (High Sensitivity): <2 ng/L (ref <18)

## 2020-10-01 MED ORDER — HYDROMORPHONE HCL 1 MG/ML IJ SOLN
0.5000 mg | Freq: Once | INTRAMUSCULAR | Status: AC
  Administered 2020-10-01: 0.5 mg via INTRAVENOUS
  Filled 2020-10-01: qty 1

## 2020-10-01 MED ORDER — IOHEXOL 350 MG/ML SOLN
100.0000 mL | Freq: Once | INTRAVENOUS | Status: AC | PRN
  Administered 2020-10-01: 100 mL via INTRAVENOUS

## 2020-10-01 MED ORDER — ONDANSETRON HCL 4 MG/2ML IJ SOLN
4.0000 mg | Freq: Once | INTRAMUSCULAR | Status: AC
  Administered 2020-10-01: 4 mg via INTRAVENOUS
  Filled 2020-10-01: qty 2

## 2020-10-01 MED ORDER — CYCLOBENZAPRINE HCL 10 MG PO TABS: 10.0000 mg | ORAL_TABLET | Freq: Two times a day (BID) | ORAL | 0 refills | Status: AC | PRN

## 2020-10-01 MED ORDER — FENTANYL CITRATE (PF) 100 MCG/2ML IJ SOLN
75.0000 ug | Freq: Once | INTRAMUSCULAR | Status: AC
  Administered 2020-10-01: 75 ug via INTRAVENOUS
  Filled 2020-10-01: qty 2

## 2020-10-01 MED ORDER — IBUPROFEN 800 MG PO TABS: 800.0000 mg | ORAL_TABLET | Freq: Three times a day (TID) | ORAL | 0 refills | Status: AC | PRN

## 2020-10-01 MED ORDER — DIAZEPAM 5 MG PO TABS
5.0000 mg | ORAL_TABLET | Freq: Once | ORAL | Status: AC
  Administered 2020-10-01: 5 mg via ORAL
  Filled 2020-10-01: qty 1

## 2020-10-01 MED ORDER — OXYCODONE-ACETAMINOPHEN 5-325 MG PO TABS
1.0000 | ORAL_TABLET | Freq: Once | ORAL | Status: AC
  Administered 2020-10-01: 1 via ORAL
  Filled 2020-10-01: qty 1

## 2020-10-01 MED ORDER — DIAZEPAM 5 MG PO TABS: 5.0000 mg | ORAL_TABLET | Freq: Four times a day (QID) | ORAL | 0 refills | Status: AC | PRN

## 2020-10-01 MED ORDER — SODIUM CHLORIDE 0.9 % IV BOLUS
1000.0000 mL | Freq: Once | INTRAVENOUS | Status: AC
  Administered 2020-10-01: 1000 mL via INTRAVENOUS

## 2020-10-01 MED ORDER — SODIUM CHLORIDE (PF) 0.9 % IJ SOLN
INTRAMUSCULAR | Status: AC
  Filled 2020-10-01: qty 50

## 2020-10-01 NOTE — Discharge Instructions (Addendum)
You have been evaluated for your chest discomfort.  Fortunately CT scan today that was obtained did not show any acute concerning finding.  Your pain is likely chest wall pain.  Consider taking ibuprofen and muscle relaxant as prescribed.  Return to the ED promptly if you have any concern.  Follow-up closely with your primary care doctor for further care.

## 2020-10-01 NOTE — ED Provider Notes (Signed)
[facility]-EMERGENCY DEPT Provider Note   CSN: [id number] Arrival date & time: 10/01/20  0501     History No chief complaint on file.   Cynthia Heath is a 40 y.o. female with no chronic past medical history who presents to the emergency department with a chief complaint of chest pain.  The patient reports right-sided chest pain that radiates up the right side of her neck and through her back and extends down to her upper abdomen that began earlier tonight as very mild pain.  She had her daughter massage her upper back and neck with no improvement in her symptoms.  The patient was able to take a sleeping aid and NSAIDs and was able to go to sleep, but awoke suddenly from sleep due to severe pain.  Pain is constant, but will wax and wane in intensity.  She characterizes the 10/10 severe pain as sharp and stabbing.  She feels as if she cannot take a deep breath due to the pain and is breathing more shallow and quickly.  Pain is also worse with some positional changes.  She feels as if she cannot move her neck.  She endorses diaphoresis and paresthesias in her right arm.   She denies fever, chills, numbness, weakness, headache, dizziness, lightheadedness, visual changes, nausea, vomiting, diarrhea, palpitations, leg swelling, dysuria, hematuria.  No history of similar pain.  No illicit or recreational drug use.  She reports infrequent social, alcohol use.  She is a non-smoker.  No recent falls or injuries.  No recent travel.  No new activities, exercises, or heavy lifting.  No concerns for pregnancy.  The history is provided by the patient and medical records. No language interpreter was used.      Past Medical History:  Diagnosis Date   Preterm labor     Patient Active Problem List   Diagnosis Date Noted   Active labor at term 03/16/2015    Past Surgical History:  Procedure Laterality Date   NO PAST SURGERIES       OB History     Gravida  3   Para  3    Term  1   Preterm  2   AB      Living  3      SAB      IAB      Ectopic      Multiple  0   Live Births  3           Family History  Problem Relation Age of Onset   Diabetes Mother     Social History   Tobacco Use   Smoking status: Never   Smokeless tobacco: Never  Substance Use Topics   Alcohol use: No   Drug use: No    Home Medications Prior to Admission medications   Medication Sig Start Date End Date Taking? Authorizing Provider  HYDROcodone-acetaminophen (NORCO/VICODIN) 5-325 MG tablet Take 1-2 tablets by mouth every 4 (four) hours as needed for severe pain. 05/08/17   Gilda Crease, MD  ibuprofen (ADVIL,MOTRIN) 800 MG tablet Take 1 tablet (800 mg total) by mouth every 8 (eight) hours as needed for moderate pain. 05/08/17   Gilda Crease, MD  senna-docusate (SENOKOT-S) 8.6-50 MG tablet Take 2 tablets by mouth daily. Patient not taking: Reported on 05/08/2017 03/18/15   Standard, Venus, CNM    Allergies    Patient has no known allergies.  Review of Systems   Review of Systems  Constitutional:  Positive for diaphoresis. Negative for activity change, chills and fever.  HENT:  Negative for congestion and sore throat.   Respiratory:  Negative for shortness of breath.   Cardiovascular:  Positive for chest pain. Negative for palpitations and leg swelling.  Gastrointestinal:  Positive for abdominal pain. Negative for constipation, diarrhea, nausea and vomiting.  Genitourinary:  Negative for dysuria, flank pain, hematuria, urgency, vaginal bleeding and vaginal pain.  Musculoskeletal:  Positive for back pain, myalgias, neck pain and neck stiffness. Negative for arthralgias and gait problem.  Skin:  Negative for rash and wound.  Allergic/Immunologic: Negative for immunocompromised state.  Neurological:  Negative for seizures, syncope, weakness, numbness and headaches.       Paresthesias  Psychiatric/Behavioral:  Negative for confusion.     Physical Exam Updated Vital Signs BP 132/86 (BP Location: Left Arm)   Pulse 88   Temp 98 F (36.7 C) (Oral)   Resp (!) 22   SpO2 100%   Physical Exam Vitals and nursing note reviewed.  Constitutional:      General: She is not in acute distress.    Appearance: She is diaphoretic. She is not toxic-appearing.     Comments: Uncomfortable appearing, anxious  HENT:     Head: Normocephalic.  Eyes:     Conjunctiva/sclera: Conjunctivae normal.  Cardiovascular:     Rate and Rhythm: Normal rate and regular rhythm.     Heart sounds: No murmur heard.   No friction rub. No gallop.     Comments: Carotid pulses are 2+ and symmetric.  Radial, DP pulses are 2+ and symmetric. Pulmonary:     Effort: No respiratory distress.     Breath sounds: No stridor. No wheezing, rhonchi or rales.     Comments: Mild tachypnea.  Lungs are clear to auscultation bilaterally.  No tracheal tugging, retractions, or accessory muscle use. Chest:     Chest wall: No tenderness.  Abdominal:     General: There is no distension.     Palpations: Abdomen is soft. There is no mass.     Tenderness: There is no abdominal tenderness. There is no right CVA tenderness, left CVA tenderness, guarding or rebound.     Hernia: No hernia is present.  Musculoskeletal:     Cervical back: Neck supple.  Skin:    General: Skin is warm.     Findings: No rash.  Neurological:     Mental Status: She is alert.     Comments: Light touch is dull on the right upper extremity as compared to the left.  Sensations intact and equal to the bilateral lower extremities.  Psychiatric:        Behavior: Behavior normal.    ED Results / Procedures / Treatments   Labs (all labs ordered are listed, but only abnormal results are displayed) Labs Reviewed  COMPREHENSIVE METABOLIC PANEL  CBC WITH DIFFERENTIAL/PLATELET  I-STAT BETA HCG BLOOD, ED ([facility], [facility], [facility] ONLY)  TROPONIN I (HIGH SENSITIVITY)    EKG EKG Interpretation  Date/Time:  Thursday  October 01 2020 05:21:03 EDT Ventricular Rate:  89 PR Interval:  124 QRS Duration: 74 QT Interval:  393 QTC Calculation: 479 R Axis:   71 Text Interpretation: Sinus rhythm Normal ECG Confirmed by Gilda Crease [phone]) on 10/01/2020 5:51:21 AM  Radiology No results found.  Procedures Procedures   Medications Ordered in ED Medications  fentaNYL (SUBLIMAZE) injection 75 mcg (has no administration in time range)  ondansetron (ZOFRAN) injection 4 mg (has no administration in time range)  ED Course  I have reviewed the triage vital signs and the nursing notes.  Pertinent labs & imaging results that were available during my care of the patient were reviewed by me and considered in my medical decision making (see chart for details).    MDM Rules/Calculators/A&P                           40 year old female with no past medical history who presents the emergency department with a chief complaint of right-sided chest pain.  She was having mild chest pain that radiated up into her neck into her back earlier in the night.  She was able to go to sleep after taking a sleep aid, but awoke from sleep due to severe pain.  Mild tachypnea on arrival.  Vital signs are otherwise unremarkable.  Labs and imaging of been reviewed and independently interpreted by me.  EKG with sinus rhythm.  Chest x-ray with increased interstitial markings bilaterally suggestive of pneumonitis.  She has a microcytic anemia, but this is stable from previous.  Remaining labs and CT are pending.  Given her presentation considered pericarditis, myocarditis, aortic dissection, or less likely PE.  She has had no infectious symptoms, but we will add on COVID-19 testing.  Patient care transferred to [facility] at the end of my shift to follow-up on CT and labs. Patient presentation, ED course, and plan of care discussed with review of all pertinent labs and imaging. Please see his/her note for further details regarding  further ED course and disposition.   Final Clinical Impression(s) / ED Diagnoses Final diagnoses:  None    Rx / DC Orders ED Discharge Orders     None        Barkley Boards, PA-C 10/01/20 0653    Gilda Crease, MD 10/01/20 [phone]

## 2020-10-01 NOTE — ED Notes (Signed)
Pt taken to CT.

## 2020-10-01 NOTE — ED Provider Notes (Signed)
Received signout from previous provider, please see her note for complete H&P.  Patient here with acute onset of pain to her chest radiates towards her right side of her neck that has been coming increasingly more uncomfortable throughout yesterday and today.  She received fentanyl earlier but pain still intensified.  Will provide additional pain medication but will obtain dissection study as well.  Initial chest x-ray demonstrate interim increased interstitial markings noted bilaterally, findings suggestive of pneumonitis.  Patient denies any environmental changes  10:06 AM Labs are reassuring CT scan for dissection study was obtained without any evidence of aortic dissection aneurysm or other acute vascular pathology.  There are scattered small pulmonary nodules throughout the lungs Measuring larger than 5 mm.  The morphology of the nodules suggest sequela from a prior infection or granulomatosis process.  Is not a smoker therefore low suspicion for malignancy.  Patient was made aware of the finding.  Her pain is very reproducible on exam primarily in the right side of her chest.  There are no rash however early shingles could manifest this way.  Encourage patient to monitor for any potential rash and if it is, may consider being reevaluated.  However she does have very reproducible muscle skeletal pain as this pain could be MSK.  She will be discharged with muscle relaxant and anti-inflammatory medication but should follow-up closely with her PCP for further evaluation and management.  Return precaution given.  BP 107/78   Pulse 91   Temp 98.1 F (36.7 C) (Oral)   Resp 18   SpO2 100%   Results for orders placed or performed during the hospital encounter of 10/01/20  Resp Panel by RT-PCR (Flu A&B, Covid) Nasopharyngeal Swab   Specimen: Nasopharyngeal Swab; Nasopharyngeal(NP) swabs in vial transport medium  Result Value Ref Range   SARS Coronavirus 2 by RT PCR NEGATIVE NEGATIVE   Influenza A  by PCR NEGATIVE NEGATIVE   Influenza B by PCR NEGATIVE NEGATIVE  Comprehensive metabolic panel  Result Value Ref Range   Sodium 138 135 - 145 mmol/L   Potassium 3.6 3.5 - 5.1 mmol/L   Chloride 102 98 - 111 mmol/L   CO2 27 22 - 32 mmol/L   Glucose, Bld 114 (H) 70 - 99 mg/dL   BUN 14 6 - 20 mg/dL   Creatinine, Ser 6.38 0.44 - 1.00 mg/dL   Calcium 8.9 8.9 - 75.6 mg/dL   Total Protein 7.3 6.5 - 8.1 g/dL   Albumin 3.8 3.5 - 5.0 g/dL   AST 20 15 - 41 U/L   ALT 18 0 - 44 U/L   Alkaline Phosphatase 54 38 - 126 U/L   Total Bilirubin 0.6 0.3 - 1.2 mg/dL   GFR, Estimated >43 >32 mL/min   Anion gap 9 5 - 15  CBC with Differential  Result Value Ref Range   WBC 7.1 4.0 - 10.5 K/uL   RBC 5.01 3.87 - 5.11 MIL/uL   Hemoglobin 11.7 (L) 12.0 - 15.0 g/dL   HCT 95.1 88.4 - 16.6 %   MCV 74.3 (L) 80.0 - 100.0 fL   MCH 23.4 (L) 26.0 - 34.0 pg   MCHC 31.5 30.0 - 36.0 g/dL   RDW 06.3 (H) 01.6 - 01.0 %   Platelets 309 150 - 400 K/uL   nRBC 0.0 0.0 - 0.2 %   Neutrophils Relative % 44 %   Neutro Abs 3.1 1.7 - 7.7 K/uL   Lymphocytes Relative 43 %   Lymphs Abs 3.1 0.7 -  4.0 K/uL   Monocytes Relative 10 %   Monocytes Absolute 0.7 0.1 - 1.0 K/uL   Eosinophils Relative 2 %   Eosinophils Absolute 0.1 0.0 - 0.5 K/uL   Basophils Relative 1 %   Basophils Absolute 0.1 0.0 - 0.1 K/uL   Immature Granulocytes 0 %   Abs Immature Granulocytes 0.01 0.00 - 0.07 K/uL  I-Stat Beta hCG blood, ED ([facility], [facility], [facility] only)  Result Value Ref Range   I-stat hCG, quantitative <5.0 <5 mIU/mL   Comment 3          Troponin I (High Sensitivity)  Result Value Ref Range   Troponin I (High Sensitivity) <2 <18 ng/L  Troponin I (High Sensitivity)  Result Value Ref Range   Troponin I (High Sensitivity) <2 <18 ng/L   DG Chest 2 View  Result Date: 10/01/2020 CLINICAL DATA:  Chest pain. EXAM: CHEST - 2 VIEW COMPARISON:  05/08/2017. FINDINGS: Mediastinum and hilar structures normal. Heart size normal. Interim increase interstitial  markings noted bilaterally. Findings suggest pneumonitis. No pleural effusion or pneumothorax. IMPRESSION: Interim increase interstitial markings noted bilaterally. Findings suggest pneumonitis. Electronically Signed   By: Maisie Fus  Register   On: 10/01/2020 06:09   CT Angio Chest/Abd/Pel for Dissection W and/or Wo Contrast  Result Date: 10/01/2020 CLINICAL DATA:  Sternal chest pain radiating into the back beginning at 4 o'clock this morning. EXAM: CT ANGIOGRAPHY CHEST, ABDOMEN AND PELVIS TECHNIQUE: Non-contrast CT of the chest was initially obtained. Multidetector CT imaging through the chest, abdomen and pelvis was performed using the standard protocol during bolus administration of intravenous contrast. Multiplanar reconstructed images and MIPs were obtained and reviewed to evaluate the vascular anatomy. CONTRAST:  OMNIPAQUE IOHEXOL 350 MG/ML SOLN COMPARISON:  CT abdomen/pelvis 02/05/2016 FINDINGS: CTA CHEST FINDINGS Cardiovascular: Initial noncontrast enhanced CT images of the chest demonstrate no evidence of acute intramural hematoma. Following the administration of intravenous contrast, the thoracic aorta is well visualized. Two vessel aortic arch. The right brachiocephalic and left common carotid artery share a common origin. Normal size of the aortic root, ascending, transverse and descending thoracic aorta. No evidence of dissection. The pulmonary arteries are also relatively well opacified to the lobar level. No evidence of filling defect to suggest acute pulmonary embolus. The heart is normal in size. No pericardial effusion. Mediastinum/Nodes: No enlarged mediastinal, hilar, or axillary lymph nodes. Thyroid gland, trachea, and esophagus demonstrate no significant findings. Lungs/Pleura: Scattered small pulmonary nodules bilaterally ranging in size from 3-5 mm. Examples include: Left upper lobe image 30, series 4; right upper lobe image 42 series 4; right lower lobe image 64, series 4; right  middle lobe image 70, series 4; right lower lobe image 87 series 4. Many of the nodules have a slightly branching were tree-in-bud pattern suggesting small impacted bronchials, or benign granulomatous disease. The lungs are otherwise clear. No emphysematous changes. Musculoskeletal: No acute fracture or aggressive appearing lytic or blastic osseous lesion. Review of the MIP images confirms the above findings. CTA ABDOMEN AND PELVIS FINDINGS VASCULAR Aorta: Normal caliber aorta without aneurysm, dissection, vasculitis or significant stenosis. Celiac: Patent without evidence of aneurysm, dissection, vasculitis or significant stenosis. SMA: Patent without evidence of aneurysm, dissection, vasculitis or significant stenosis. Renals: Both renal arteries are patent without evidence of aneurysm, dissection, vasculitis, fibromuscular dysplasia or significant stenosis. IMA: Patent without evidence of aneurysm, dissection, vasculitis or significant stenosis. Inflow: Patent without evidence of aneurysm, dissection, vasculitis or significant stenosis. Veins: No focal venous abnormality. Review of the MIP images  confirms the above findings. NON-VASCULAR Hepatobiliary: No focal liver abnormality is seen. No gallstones, gallbladder wall thickening, or biliary dilatation. Pancreas: Unremarkable. No pancreatic ductal dilatation or surrounding inflammatory changes. Spleen: Normal in size without focal abnormality. Adrenals/Urinary Tract: Adrenal glands are unremarkable. Kidneys are normal, without renal calculi, focal lesion, or hydronephrosis. Bladder is unremarkable. Stomach/Bowel: Stomach is within normal limits. Appendix appears normal. No evidence of bowel wall thickening, distention, or inflammatory changes. Lymphatic: No suspicious lymphadenopathy. Reproductive: Uterus and bilateral adnexa are unremarkable. Other: No abdominal wall hernia or abnormality. No abdominopelvic ascites. Musculoskeletal: No acute fracture or  aggressive appearing lytic or blastic osseous lesion. Focal L5-S1 degenerative disc disease. Review of the MIP images confirms the above findings. IMPRESSION: 1. No evidence of aortic dissection, aneurysm or other acute vascular pathology. 2. No acute abnormality within the chest, abdomen or pelvis. 3. Scattered small pulmonary modules throughout the lungs none measuring larger than 5 mm. The morphology of the nodules suggests sequelae from a prior infectious/inflammatory or granulomatous process. No follow-up needed if patient is low-risk (and has no known or suspected primary neoplasm). Non-contrast chest CT can be considered in 12 months if patient is high-risk. This recommendation follows the consensus statement: Guidelines for Management of Incidental Pulmonary Nodules Detected on CT Images:From the Fleischner Society 2017; published online before print (10.1148/radiol.9371696789). 4. Focal L5-S1 degenerative disc disease. Electronically Signed   By: Malachy Moan M.D.   On: 10/01/2020 09:32      Fayrene Helper, PA-C 10/01/20 1120    Terrilee Files, MD 10/01/20 [phone]

## 2020-10-01 NOTE — ED Triage Notes (Signed)
Pt arrived via POV, c/o sternal chest pain, radiating to her back that started at 4am this morning, woke her from sleep. Continuous, worsening with mvmt or deep breathing.

## 2021-06-14 ENCOUNTER — Institutional Professional Consult (permissible substitution): Payer: Self-pay | Admitting: Primary Care

## 2021-06-14 DIAGNOSIS — D509 Iron deficiency anemia, unspecified: Secondary | ICD-10-CM | POA: Insufficient documentation

## 2021-06-14 DIAGNOSIS — G44209 Tension-type headache, unspecified, not intractable: Secondary | ICD-10-CM | POA: Insufficient documentation

## 2021-06-14 NOTE — Progress Notes (Deleted)
? ?@Patient  ID: , female    DOB: August 17, 1980, 41 y.o.   MRN: [id number] ? ?No chief complaint on file. ? ? ?Referring provider: ?[facility] ? ?HPI: ?41 year old female, never smoked. PMH significant for GERD, iron deficiency anemia, pre-diabetes, tension headache, h.pylori, difficulty sleeping, hypersomnia with sleep apnea, obesity. ? ?06/14/2021 ?Patient presents today for sleep consult.  ? ? ? ?  ?Sleep questionnaire ?Symptoms-    ?Prior sleep study-  ?Bedtime-  ?Time to fall asleep-  ?Nocturnal awakenings-  ?Out of bed/start of day-  ?Weight changes-  ?Do you operate heavy machinery-  ?Do you currently wear CPAP- ?Do you current wear oxygen-  ?Epworth-  ? ? ?No Known Allergies ? ?There is no immunization history for the selected administration types on file for this patient. ? ?Past Medical History:  ?Diagnosis Date  ? Preterm labor   ? ? ?Tobacco History: ?Social History  ? ?Tobacco Use  ?Smoking Status Never  ?Smokeless Tobacco Never  ? ?Counseling given: Not Answered ? ? ?Outpatient Medications Prior to Visit  ?Medication Sig Dispense Refill  ? acetaminophen (TYLENOL) 500 MG tablet Take 1,000 mg by mouth every 6 (six) hours as needed for mild pain or moderate pain.    ? aspirin EC 81 MG tablet Take 81 mg by mouth daily as needed for mild pain or moderate pain.    ? cyclobenzaprine (FLEXERIL) 10 MG tablet Take 1 tablet (10 mg total) by mouth 2 (two) times daily as needed for muscle spasms. 20 tablet 0  ? diazepam (VALIUM) 5 MG tablet Take 1 tablet (5 mg total) by mouth every 6 (six) hours as needed for muscle spasms (spasms). 5 tablet 0  ? Ferrous Sulfate (IRON PO) Take 1 Dose by mouth daily.    ? ibuprofen (ADVIL) 800 MG tablet Take 1 tablet (800 mg total) by mouth every 8 (eight) hours as needed for moderate pain. 21 tablet 0  ? MELATONIN PO Take 1 tablet by mouth at bedtime as needed (sleep).    ? Multiple Vitamin (MULTIVITAMIN) tablet Take 1 tablet by mouth daily.    ? OVER THE  COUNTER MEDICATION Take 1 Dose by mouth daily. COVID vitamin from [employer]    ? OVER THE COUNTER MEDICATION Take 1 Dose by mouth at bedtime as needed (sleep). OTC Sleep Aid    ? ?No facility-administered medications prior to visit.  ? ? ? ? ?Review of Systems ? ?Review of Systems ? ? ?Physical Exam ? ?There were no vitals taken for this visit. ?Physical Exam  ? ?Lab Results: ? ?CBC ?   ?Component Value Date/Time  ? WBC 7.1 10/01/2020 0621  ? RBC 5.01 10/01/2020 0621  ? HGB 11.7 (L) 10/01/2020 10/03/2020  ? HCT 37.2 10/01/2020 0621  ? PLT 309 10/01/2020 0621  ? MCV 74.3 (L) 10/01/2020 10/03/2020  ? MCV 89.3 10/04/2011 2011  ? MCH 23.4 (L) 10/01/2020 10/03/2020  ? MCHC 31.5 10/01/2020 0621  ? RDW 18.5 (H) 10/01/2020 10/03/2020  ? LYMPHSABS 3.1 10/01/2020 0621  ? MONOABS 0.7 10/01/2020 0621  ? EOSABS 0.1 10/01/2020 10/03/2020  ? BASOSABS 0.1 10/01/2020 10/03/2020  ? ? ?BMET ?   ?Component Value Date/Time  ? NA 138 10/01/2020 0621  ? K 3.6 10/01/2020 0621  ? CL 102 10/01/2020 0621  ? CO2 27 10/01/2020 0621  ? GLUCOSE 114 (H) 10/01/2020 10/03/2020  ? BUN 14 10/01/2020 0621  ? CREATININE 0.91 10/01/2020 0621  ? CALCIUM 8.9 10/01/2020 0621  ?  GFRNONAA >60 10/01/2020 0786  ? GFRAA >60 05/08/2017 0322  ? ? ?BNP ?No results found for: BNP ? ?ProBNP ?No results found for: PROBNP ? ?Imaging: ?No results found. ? ? ?Assessment & Plan:  ? ?No problem-specific Assessment & Plan notes found for this encounter. ? ? ? ? ?Glenford Bayley, NP ?06/14/2021 ? ?

## 2021-07-03 NOTE — Progress Notes (Signed)
07/06/21- 40 yoF never smoker for sleep evaluation courtesy of Carilyn Goodpasture, NP with concern of OSA ?Medical problem list includes GERD, Migraine, Obesity, Insomnia due to stress ?Epworth score-18 ?Body weight today- ?Covid vax- J&J ?Flu vax-had ?Told of loud snoring.  Wakes up with right frontal headache many days and feels as if she is not slept.  Aware of breath-holding and sleep.  We will bedtime 11 PM-midnight with sleep latency 20-30 minutes before up at 6:30 AM. ?Denies ENT surgery heart or lung disease.  Denies family history of sleep apnea. ?Currently unmarried but engaged to be married, living with her children.  Working as Sales promotion account executive. ? ?Prior to Admission medications   ?Medication Sig Start Date End Date Taking? Authorizing Provider  ?acetaminophen (TYLENOL) 500 MG tablet Take 1,000 mg by mouth every 6 (six) hours as needed for mild pain or moderate pain.   Yes [provider]  ?aspirin EC 81 MG tablet Take 81 mg by mouth daily as needed for mild pain or moderate pain.   Yes [provider]  ?cyclobenzaprine (FLEXERIL) 10 MG tablet Take 1 tablet (10 mg total) by mouth 2 (two) times daily as needed for muscle spasms. 10/01/20  Yes Fayrene Helper, PA-C  ?diazepam (VALIUM) 5 MG tablet Take 1 tablet (5 mg total) by mouth every 6 (six) hours as needed for muscle spasms (spasms). 10/01/20  Yes Fayrene Helper, PA-C  ?Ferrous Sulfate (IRON PO) Take 1 Dose by mouth daily.   Yes [provider]  ?ibuprofen (ADVIL) 800 MG tablet Take 1 tablet (800 mg total) by mouth every 8 (eight) hours as needed for moderate pain. 10/01/20  Yes Fayrene Helper, PA-C  ?MELATONIN PO Take 1 tablet by mouth at bedtime as needed (sleep).   Yes [provider]  ?Multiple Vitamin (MULTIVITAMIN) tablet Take 1 tablet by mouth daily.   Yes [provider]  ?OVER THE COUNTER MEDICATION Take 1 Dose by mouth daily. COVID vitamin from [employer]   Yes [provider]  ?OVER THE COUNTER MEDICATION Take  1 Dose by mouth at bedtime as needed (sleep). OTC Sleep Aid   Yes [provider]  ? ?/pmh ?Past Surgical History:  ?Procedure Laterality Date  ? NO PAST SURGERIES    ? ?Family History  ?Problem Relation Age of Onset  ? Diabetes Mother   ? ?Social History  ? ?Socioeconomic History  ? Marital status: Divorced  ?  Spouse name: Not on file  ? Number of children: Not on file  ? Years of education: Not on file  ? Highest education level: Not on file  ?Occupational History  ? Not on file  ?Tobacco Use  ? Smoking status: Never  ? Smokeless tobacco: Never  ?Vaping Use  ? Vaping Use: Never used  ?Substance and Sexual Activity  ? Alcohol use: No  ? Drug use: No  ? Sexual activity: Yes  ?  Birth control/protection: None  ?Other Topics Concern  ? Not on file  ?Social History Narrative  ? Not on file  ? ?Social Determinants of Health  ? ?Financial Resource Strain: Not on file  ?Food Insecurity: Not on file  ?Transportation Needs: Not on file  ?Physical Activity: Not on file  ?Stress: Not on file  ?Social Connections: Not on file  ?Intimate Partner Violence: Not on file  ? ?ROS-see HPI   + = positive ?Constitutional:    weight loss, night sweats, fevers, chills, fatigue, lassitude. ?HEENT:    +headaches, +difficulty swallowing, tooth/dental problems,  sore throat,  ?     sneezing, itching, ear ache, nasal congestion, post nasal drip, snoring ?CV:    +chest pain, orthopnea, PND, swelling in lower extremities, anasarca,                           dizziness, +palpitations ?Resp:   +shortness of breath with exertion or at rest.   ?             productive cough,   +non-productive cough, coughing up of blood.   ?           change in color of mucus.  wheezing.   ?Skin:    rash or lesions. ?GI:  No-   heartburn, +indigestion, abdominal pain, nausea, vomiting, diarrhea,  ?               change in bowel habits, loss of appetite ?GU: dysuria, change in color of urine, no urgency or frequency.   flank pain. ?MS:   joint pain,  +stiffness, decreased range of motion, back pain. ?Neuro-     nothing unusual ?Psych:  change in mood or affect.  depression or +anxiety.   memory loss. ? ?OBJ- Physical Exam ?General- Alert, Oriented, Affect-appropriate, Distress- none acute ?Skin- rash-none, lesions- none, excoriation- none ?Lymphadenopathy- none ?Head- atraumatic ?           Eyes- Gross vision intact, PERRLA, conjunctivae and secretions clear ?           Ears- Hearing, canals-normal ?           Nose- Clear, no-Septal dev, mucus, polyps, erosion, perforation  ?           Throat- Mallampati IV , mucosa clear , drainage- none, tonsils- atrophic, +teeth ?Neck- flexible , trachea midline, no stridor , thyroid nl, carotid no bruit ?Chest - symmetrical excursion , unlabored ?          Heart/CV- RRR , no murmur , no gallop  , no rub, nl s1 s2 ?                          - JVD- none , edema- none, stasis changes- none, varices- none ?          Lung- clear to P&A, wheeze- none, cough- none , dullness-none, rub- none ?          Chest wall-  ?Abd-  ?Br/ Gen/ Rectal- Not done, not indicated ?Extrem- cyanosis- none, clubbing, none, atrophy- none, strength- nl ?Neuro- grossly intact to observation ? ?

## 2021-07-06 ENCOUNTER — Encounter: Payer: Self-pay | Admitting: Internal Medicine

## 2021-07-06 ENCOUNTER — Ambulatory Visit (INDEPENDENT_AMBULATORY_CARE_PROVIDER_SITE_OTHER): Payer: Managed Care, Other (non HMO) | Admitting: Internal Medicine

## 2021-07-06 VITALS — BP 102/68 | HR 65 | Ht 66.0 in | Wt 189.4 lb

## 2021-07-06 DIAGNOSIS — R0683 Snoring: Secondary | ICD-10-CM

## 2021-07-06 DIAGNOSIS — G479 Sleep disorder, unspecified: Secondary | ICD-10-CM

## 2021-07-06 DIAGNOSIS — G4710 Hypersomnia, unspecified: Secondary | ICD-10-CM

## 2021-07-06 DIAGNOSIS — G4730 Sleep apnea, unspecified: Secondary | ICD-10-CM

## 2021-07-06 NOTE — Patient Instructions (Signed)
Order- schedule home sleep test    dx Snoring ° °Please call us for results and recommendations about 2 weeks after your sleep test. ° ° °

## 2021-07-09 ENCOUNTER — Encounter: Payer: Self-pay | Admitting: Internal Medicine

## 2021-07-09 NOTE — Assessment & Plan Note (Signed)
There may be an insomnia component which we can address after sleep study. ?

## 2021-07-09 NOTE — Assessment & Plan Note (Signed)
Likely some degree of OSA. Appropriate discussion including medical concerns, treatments, safe driving responsibility.I think she would be most interested in OAP if appropriate.Plan- sleep study ?

## 2021-09-09 ENCOUNTER — Ambulatory Visit
Admission: RE | Admit: 2021-09-09 | Discharge: 2021-09-09 | Disposition: A | Discharge status: Home / Self Care | Payer: 59 | Source: Ambulatory Visit | Attending: Family Medicine | Admitting: Family Medicine

## 2021-09-09 ENCOUNTER — Other Ambulatory Visit: Payer: Self-pay | Admitting: Family Medicine

## 2021-09-09 DIAGNOSIS — Z01818 Encounter for other preprocedural examination: Secondary | ICD-10-CM

## 2021-09-09 DIAGNOSIS — K429 Umbilical hernia without obstruction or gangrene: Secondary | ICD-10-CM | POA: Diagnosis not present

## 2021-09-16 DIAGNOSIS — R319 Hematuria, unspecified: Secondary | ICD-10-CM | POA: Diagnosis not present

## 2021-09-21 DIAGNOSIS — Z01818 Encounter for other preprocedural examination: Secondary | ICD-10-CM | POA: Diagnosis not present

## 2021-09-21 DIAGNOSIS — D509 Iron deficiency anemia, unspecified: Secondary | ICD-10-CM | POA: Diagnosis not present

## 2021-10-15 DIAGNOSIS — G8918 Other acute postprocedural pain: Secondary | ICD-10-CM | POA: Diagnosis not present

## 2021-10-25 DIAGNOSIS — Z9889 Other specified postprocedural states: Secondary | ICD-10-CM | POA: Diagnosis not present

## 2021-11-03 DIAGNOSIS — L905 Scar conditions and fibrosis of skin: Secondary | ICD-10-CM | POA: Diagnosis not present

## 2021-11-03 DIAGNOSIS — L910 Hypertrophic scar: Secondary | ICD-10-CM | POA: Diagnosis not present

## 2021-11-03 DIAGNOSIS — Z789 Other specified health status: Secondary | ICD-10-CM | POA: Diagnosis not present

## 2021-11-29 DIAGNOSIS — L910 Hypertrophic scar: Secondary | ICD-10-CM | POA: Diagnosis not present

## 2021-12-06 DIAGNOSIS — L298 Other pruritus: Secondary | ICD-10-CM | POA: Diagnosis not present

## 2021-12-06 DIAGNOSIS — L538 Other specified erythematous conditions: Secondary | ICD-10-CM | POA: Diagnosis not present

## 2021-12-06 DIAGNOSIS — L905 Scar conditions and fibrosis of skin: Secondary | ICD-10-CM | POA: Diagnosis not present

## 2021-12-06 DIAGNOSIS — Z789 Other specified health status: Secondary | ICD-10-CM | POA: Diagnosis not present

## 2021-12-06 DIAGNOSIS — L910 Hypertrophic scar: Secondary | ICD-10-CM | POA: Diagnosis not present

## 2021-12-24 DIAGNOSIS — L910 Hypertrophic scar: Secondary | ICD-10-CM | POA: Diagnosis not present

## 2022-01-06 DIAGNOSIS — L298 Other pruritus: Secondary | ICD-10-CM | POA: Diagnosis not present

## 2022-01-06 DIAGNOSIS — Z789 Other specified health status: Secondary | ICD-10-CM | POA: Diagnosis not present

## 2022-01-06 DIAGNOSIS — L538 Other specified erythematous conditions: Secondary | ICD-10-CM | POA: Diagnosis not present

## 2022-01-06 DIAGNOSIS — L910 Hypertrophic scar: Secondary | ICD-10-CM | POA: Diagnosis not present

## 2022-03-06 IMAGING — CT CT ANGIO CHEST-ABD-PELV FOR DISSECTION W/ AND WO/W CM
2 of 8 series · 13 of 46 positions shown, 15 images · IV contrast (OMNIPAQUE)
Comparison: CT abdomen/pelvis 02/05/2016

CLINICAL DATA: Sternal chest pain radiating into the back beginning
at 4 o'clock this morning.

EXAM:
CT ANGIOGRAPHY CHEST, ABDOMEN AND PELVIS
TECHNIQUE: Non-contrast CT of the chest was initially obtained.

[Series 6: axial arterial · axial · arterial · 0.64mm/px · z∈[+1020,+1545]mm · 10 of 211 slices shown, 12 images]
[im 18/211  soft-tissue]
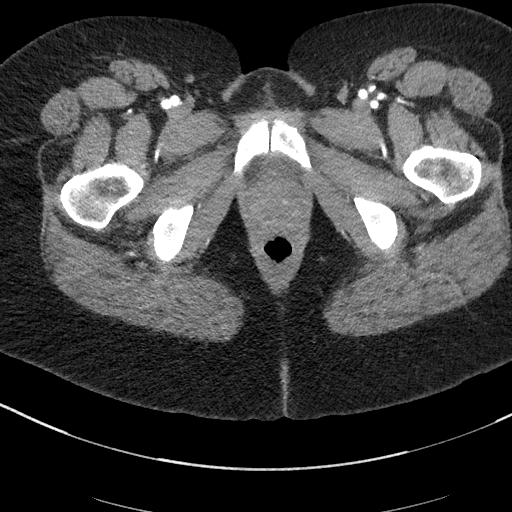
[im 18/211  bone]
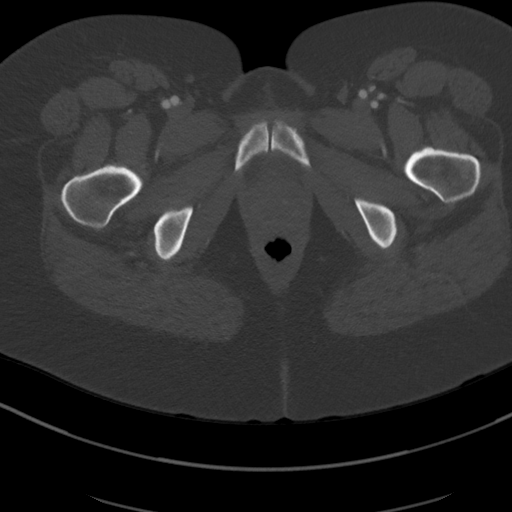
[im 36/211  soft-tissue]
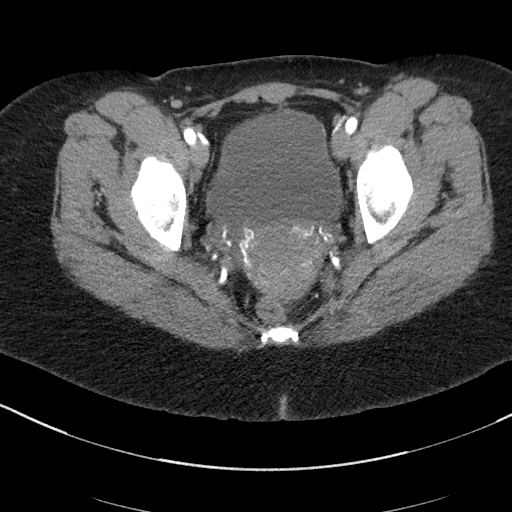
[im 53/211  soft-tissue]
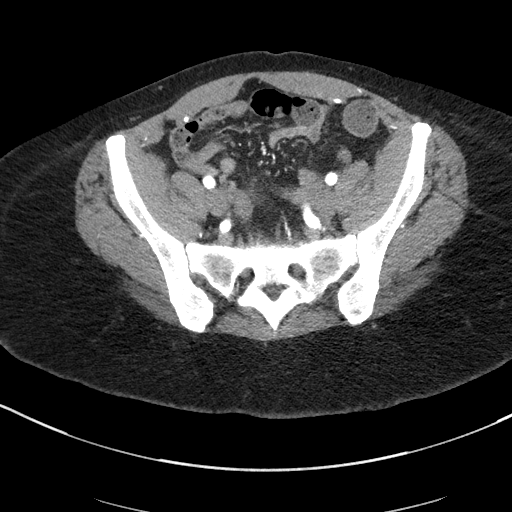
[im 71/211  soft-tissue]
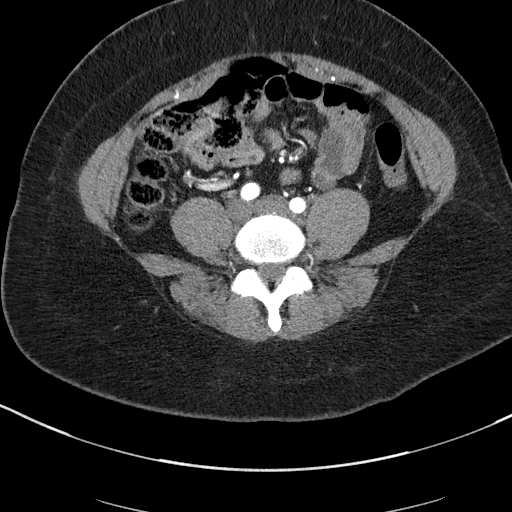
[im 88/211  soft-tissue]
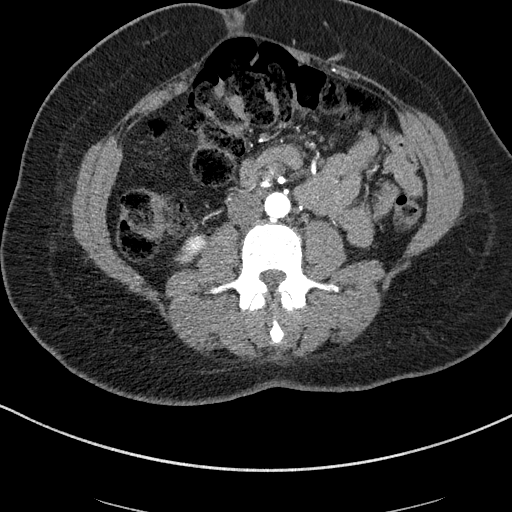
[im 123/211  soft-tissue]
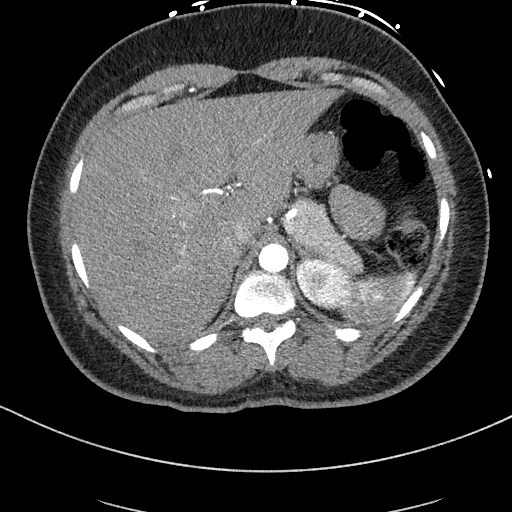
[im 141/211  soft-tissue]
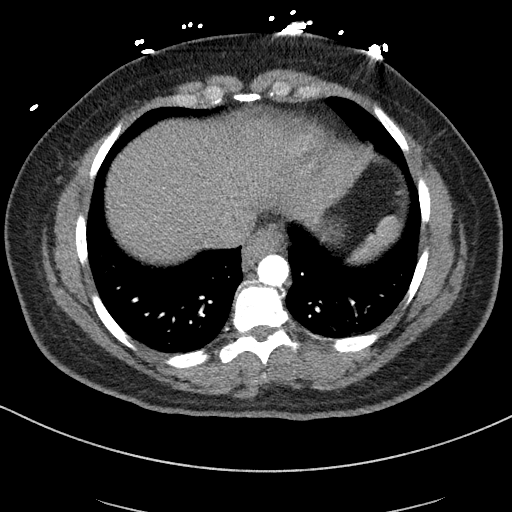
[im 158/211  soft-tissue]
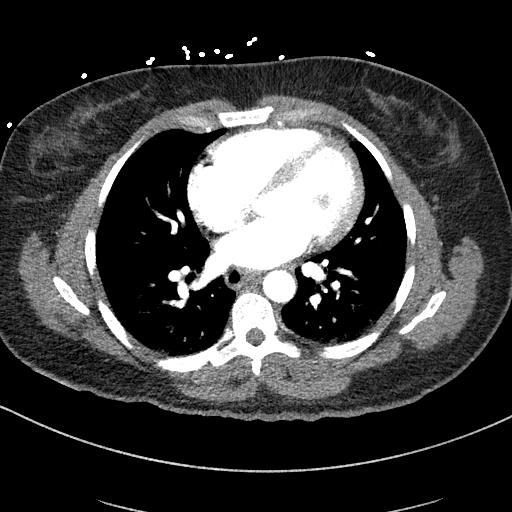
[im 176/211  soft-tissue]
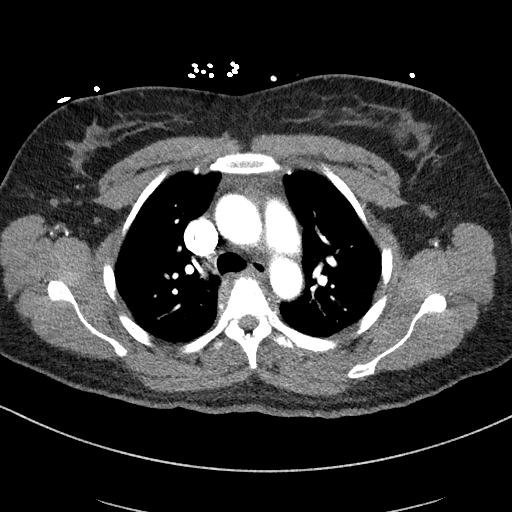
[im 176/211  bone]
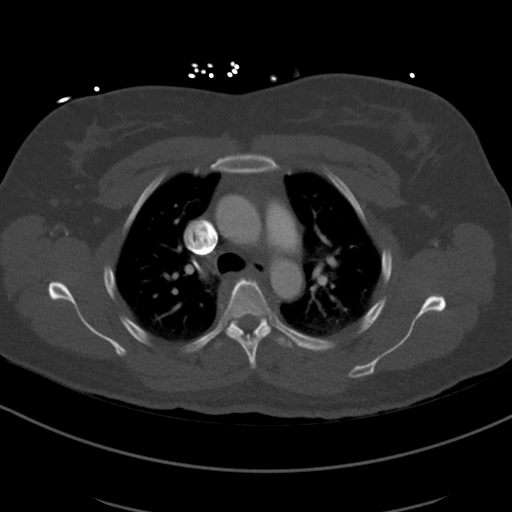
[im 193/211  soft-tissue]
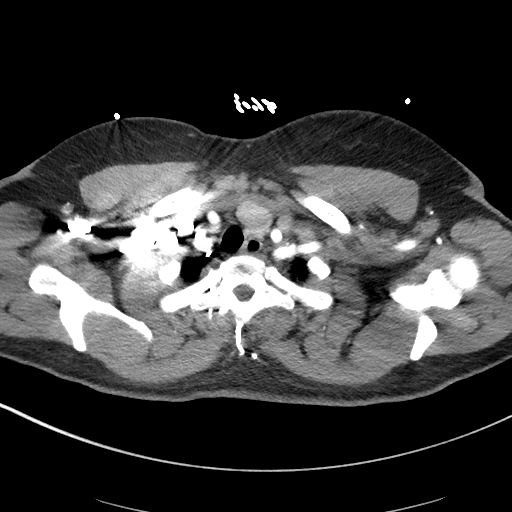

[Series 9: coronals · coronal · 0.79mm/px · 3 of 134 slices shown]
[im 34/134  soft-tissue]
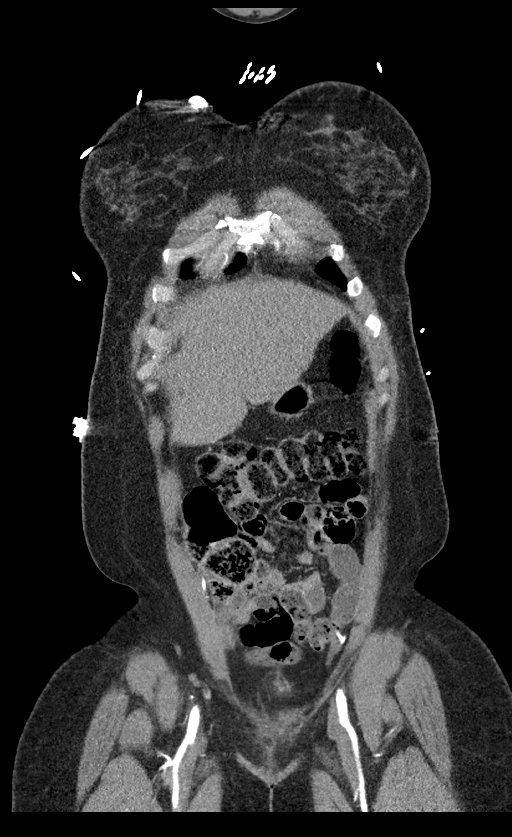
[im 67/134  soft-tissue]
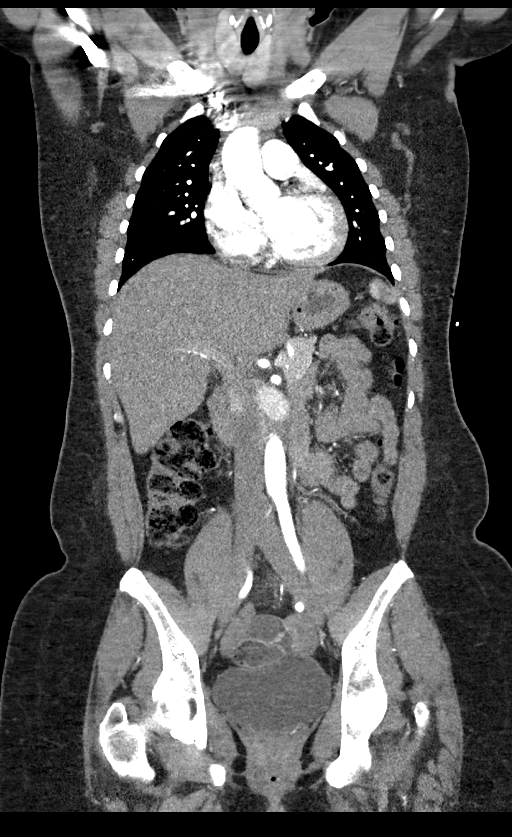
[im 100/134  soft-tissue]
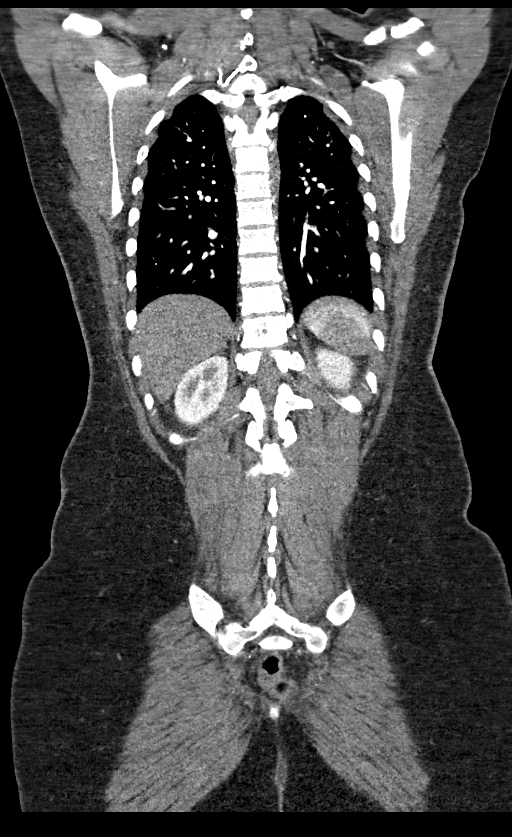

[13 of 46 positions shown; findings below may reference images not displayed]

Multidetector CT imaging through the chest, abdomen and pelvis was
performed using the standard protocol during bolus administration of
intravenous contrast. Multiplanar reconstructed images and MIPs were
obtained and reviewed to evaluate the vascular anatomy.

CONTRAST:  100mL OMNIPAQUE IOHEXOL 350 MG/ML SOLN
FINDINGS: CTA CHEST FINDINGS

Cardiovascular: Initial noncontrast enhanced CT images of the chest
demonstrate no evidence of acute intramural hematoma. Following the
administration of intravenous contrast, the thoracic aorta is well
visualized. Two vessel aortic arch. The right brachiocephalic and
left common carotid artery share a common origin. Normal size of the
aortic root, ascending, transverse and descending thoracic aorta. No
evidence of dissection. The pulmonary arteries are also relatively
well opacified to the lobar level. No evidence of filling defect to
suggest acute pulmonary embolus. The heart is normal in size. No
pericardial effusion.

Mediastinum/Nodes: No enlarged mediastinal, hilar, or axillary lymph
nodes. Thyroid gland, trachea, and esophagus demonstrate no
significant findings.

Lungs/Pleura: Scattered small pulmonary nodules bilaterally ranging
in size from 3-5 mm. Examples include: Left upper lobe image 30,
series 4; right upper lobe image 42 series 4; right lower lobe image
64, series 4; right middle lobe image 70, series 4; right lower lobe
image 87 series 4. Many of the nodules have a slightly branching
were tree-in-bud pattern suggesting small impacted bronchials, or
benign granulomatous disease. The lungs are otherwise clear. No
emphysematous changes.

Musculoskeletal: No acute fracture or aggressive appearing lytic or
blastic osseous lesion.

Review of the MIP images confirms the above findings.

CTA ABDOMEN AND PELVIS FINDINGS

VASCULAR

Aorta: Normal caliber aorta without aneurysm, dissection, vasculitis
or significant stenosis.

Celiac: Patent without evidence of aneurysm, dissection, vasculitis
or significant stenosis.

SMA: Patent without evidence of aneurysm, dissection, vasculitis or
significant stenosis.

Renals: Both renal arteries are patent without evidence of aneurysm,
dissection, vasculitis, fibromuscular dysplasia or significant
stenosis.

IMA: Patent without evidence of aneurysm, dissection, vasculitis or
significant stenosis.

Inflow: Patent without evidence of aneurysm, dissection, vasculitis
or significant stenosis.

Veins: No focal venous abnormality.

Review of the MIP images confirms the above findings.

NON-VASCULAR

Hepatobiliary: No focal liver abnormality is seen. No gallstones,
gallbladder wall thickening, or biliary dilatation.

Pancreas: Unremarkable. No pancreatic ductal dilatation or
surrounding inflammatory changes.

Spleen: Normal in size without focal abnormality.

Adrenals/Urinary Tract: Adrenal glands are unremarkable. Kidneys are
normal, without renal calculi, focal lesion, or hydronephrosis.
Bladder is unremarkable.

Stomach/Bowel: Stomach is within normal limits. Appendix appears
normal. No evidence of bowel wall thickening, distention, or
inflammatory changes.

Lymphatic: No suspicious lymphadenopathy.

Reproductive: Uterus and bilateral adnexa are unremarkable.

Other: No abdominal wall hernia or abnormality. No abdominopelvic
ascites.

Musculoskeletal: No acute fracture or aggressive appearing lytic or
blastic osseous lesion. Focal L5-S1 degenerative disc disease.

Review of the MIP images confirms the above findings.
IMPRESSION: 1. No evidence of aortic dissection, aneurysm or other acute
vascular pathology.
2. No acute abnormality within the chest, abdomen or pelvis.
3. Scattered small pulmonary modules throughout the lungs none
measuring larger than 5 mm. The morphology of the nodules suggests
sequelae from a prior infectious/inflammatory or granulomatous
process. No follow-up needed if patient is low-risk (and has no
known or suspected primary neoplasm). Non-contrast chest CT can be
considered in 12 months if patient is high-risk. This recommendation
follows the consensus statement: Guidelines for Management of
Incidental Pulmonary Nodules Detected on CT Images:From the
[HOSPITAL] 5535; published online before print
(10.1148/[id number]).
4. Focal L5-S1 degenerative disc disease.

## 2022-03-06 IMAGING — CR DG CHEST 2V
2 series · 2 of 2 positions shown · non-contrast
Comparison: 05/08/2017.

CLINICAL DATA: Chest pain.

EXAM:
CHEST - 2 VIEW

[w chest pa]
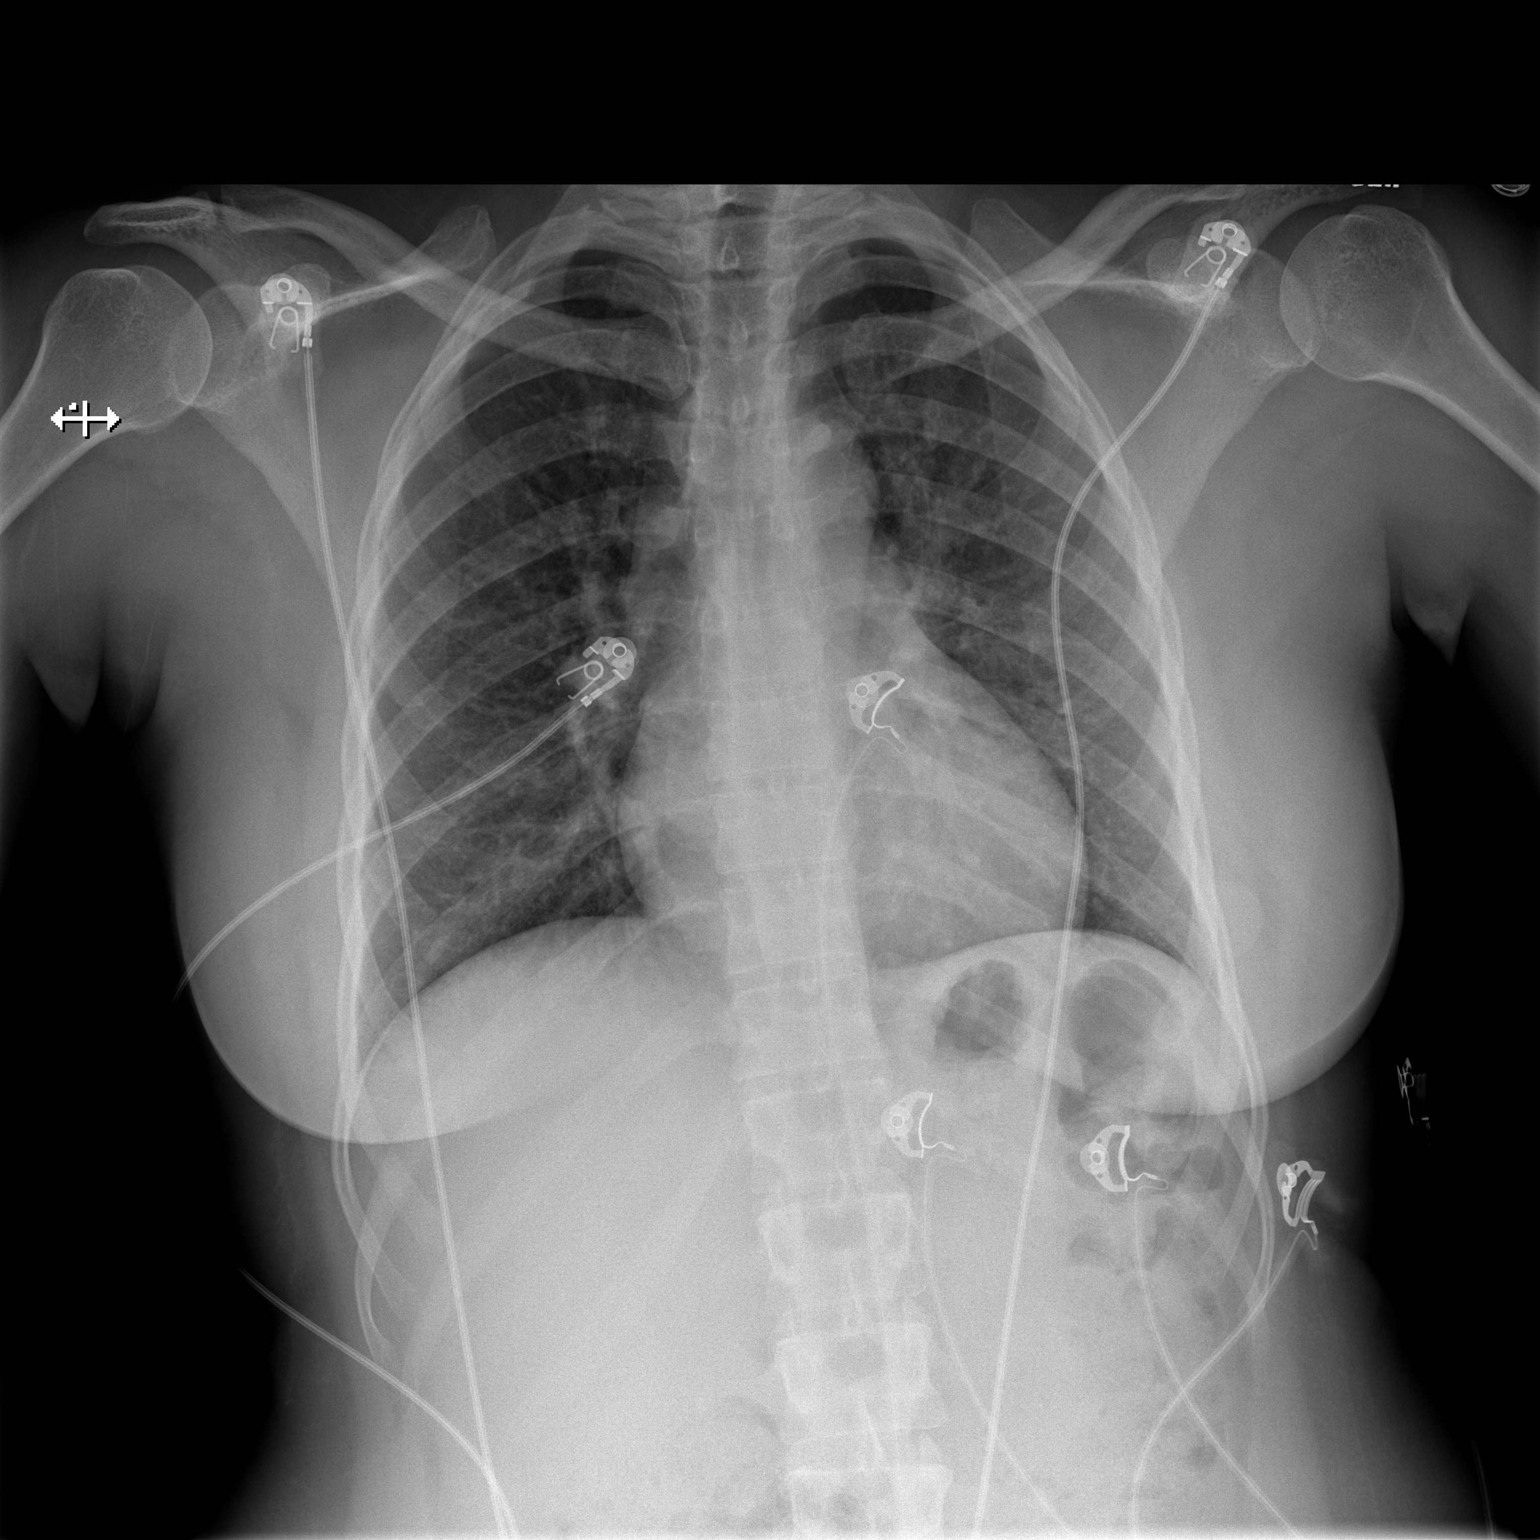

[w chest lat]
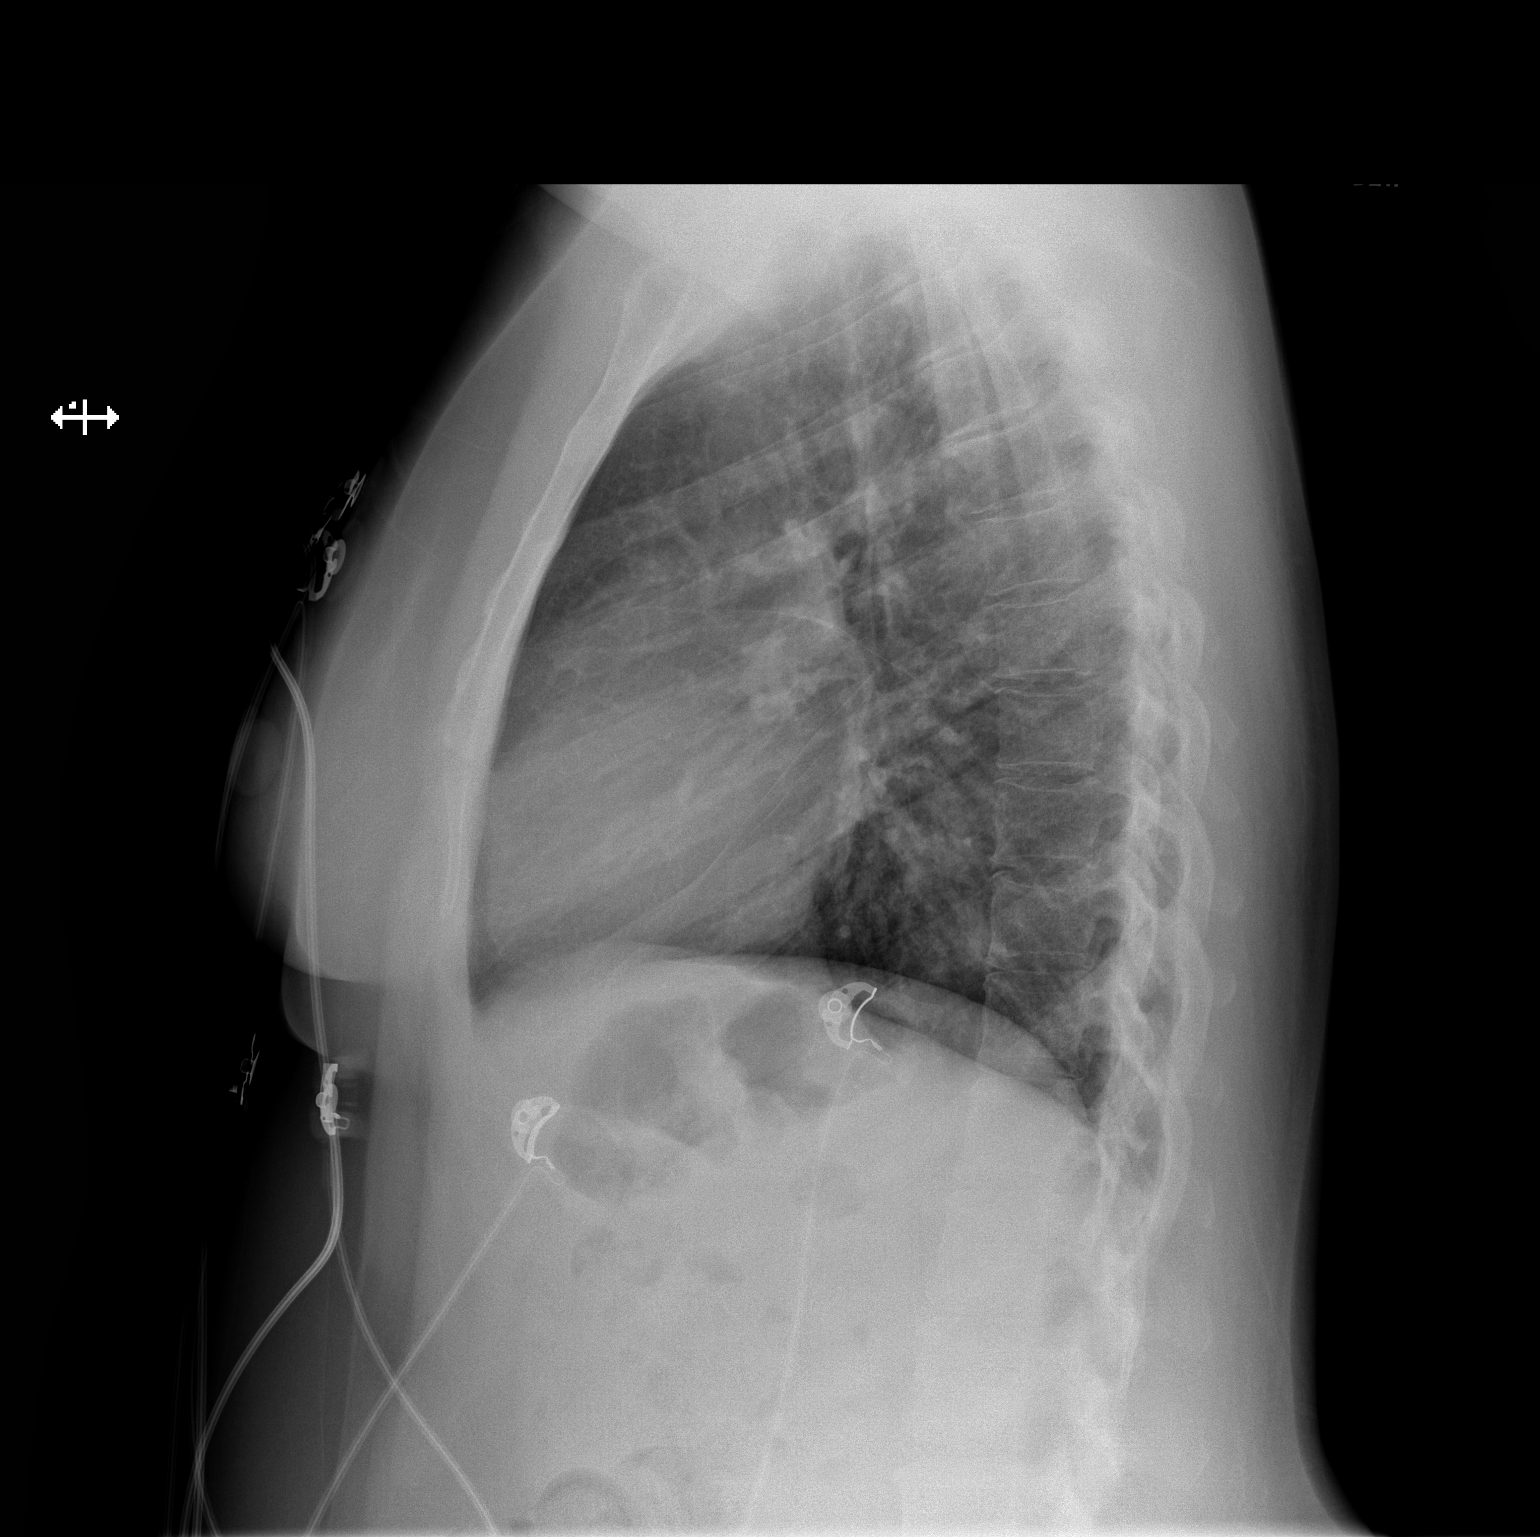

[2 of 2 positions shown; findings below may reference images not displayed]

FINDINGS: Mediastinum and hilar structures normal. Heart size normal. Interim
increase interstitial markings noted bilaterally. Findings suggest
pneumonitis. No pleural effusion or pneumothorax.
IMPRESSION: Interim increase interstitial markings noted bilaterally. Findings
suggest pneumonitis.

## 2022-07-11 ENCOUNTER — Telehealth: Payer: Self-pay | Admitting: Internal Medicine

## 2022-07-14 NOTE — Telephone Encounter (Signed)
Pt needs to contact our medical records dept to have records sent to new provider. Attempted to call pt but unable to reach. Left her a detailed message with the medical records phone number for her to call. Nothing further needed.

## 2023-05-16 ENCOUNTER — Other Ambulatory Visit: Payer: Self-pay | Admitting: Physician Assistant

## 2023-05-16 DIAGNOSIS — Z1231 Encounter for screening mammogram for malignant neoplasm of breast: Secondary | ICD-10-CM

## 2023-06-02 ENCOUNTER — Other Ambulatory Visit: Payer: Self-pay | Admitting: Physician Assistant

## 2023-06-02 ENCOUNTER — Ambulatory Visit
Admission: RE | Admit: 2023-06-02 | Discharge: 2023-06-02 | Disposition: A | Discharge status: Home / Self Care | Source: Ambulatory Visit | Attending: Physician Assistant | Admitting: Physician Assistant

## 2023-06-02 DIAGNOSIS — Z1231 Encounter for screening mammogram for malignant neoplasm of breast: Secondary | ICD-10-CM

## 2023-06-07 ENCOUNTER — Other Ambulatory Visit: Payer: Self-pay | Admitting: Physician Assistant

## 2023-06-07 DIAGNOSIS — R928 Other abnormal and inconclusive findings on diagnostic imaging of breast: Secondary | ICD-10-CM

## 2023-11-29 ENCOUNTER — Telehealth: Payer: Self-pay

## 2023-11-29 NOTE — Telephone Encounter (Signed)
 Received referral from Dorthea Ponder PA at [facility] [phone] for loud snoring. Placed in sleep mailbox

## 2023-12-05 ENCOUNTER — Other Ambulatory Visit: Payer: Self-pay | Admitting: Physician Assistant

## 2023-12-05 ENCOUNTER — Ambulatory Visit
Admission: RE | Admit: 2023-12-05 | Discharge: 2023-12-05 | Disposition: A | Source: Ambulatory Visit | Attending: Physician Assistant | Admitting: Physician Assistant

## 2023-12-05 DIAGNOSIS — M542 Cervicalgia: Secondary | ICD-10-CM

## 2023-12-26 ENCOUNTER — Encounter: Payer: Self-pay | Admitting: Neurology

## 2023-12-26 ENCOUNTER — Institutional Professional Consult (permissible substitution): Admitting: Neurology

## 2023-12-29 ENCOUNTER — Encounter: Payer: Self-pay | Admitting: Neurology

## 2023-12-29 ENCOUNTER — Ambulatory Visit (INDEPENDENT_AMBULATORY_CARE_PROVIDER_SITE_OTHER): Admitting: Neurology

## 2023-12-29 ENCOUNTER — Telehealth: Payer: Self-pay | Admitting: Neurology

## 2023-12-29 VITALS — BP 101/70 | HR 95 | Ht 64.0 in | Wt 187.0 lb

## 2023-12-29 DIAGNOSIS — R0683 Snoring: Secondary | ICD-10-CM

## 2023-12-29 DIAGNOSIS — Z9189 Other specified personal risk factors, not elsewhere classified: Secondary | ICD-10-CM

## 2023-12-29 DIAGNOSIS — G4719 Other hypersomnia: Secondary | ICD-10-CM | POA: Diagnosis not present

## 2023-12-29 DIAGNOSIS — E66811 Obesity, class 1: Secondary | ICD-10-CM

## 2023-12-29 DIAGNOSIS — R002 Palpitations: Secondary | ICD-10-CM

## 2023-12-29 DIAGNOSIS — R0689 Other abnormalities of breathing: Secondary | ICD-10-CM

## 2023-12-29 DIAGNOSIS — R0681 Apnea, not elsewhere classified: Secondary | ICD-10-CM

## 2023-12-29 DIAGNOSIS — R519 Headache, unspecified: Secondary | ICD-10-CM

## 2023-12-29 DIAGNOSIS — R351 Nocturia: Secondary | ICD-10-CM

## 2023-12-29 NOTE — Patient Instructions (Addendum)
 Thank you for choosing [facility] for your sleep related care! It was nice to meet you today!   Here is what we discussed today:   Talk to your PCP about seeing a cardiologist for your palpitations.   You may be a candidate for Zepbound if you have sleep apnea and are working on weight loss. Talk to Dorthea Ponder about this.    Based on your symptoms and your exam I believe you are at risk for obstructive sleep apnea (aka OSA). We should proceed with a sleep study to determine whether you do or do not have OSA and how severe it is. Even, if you have mild OSA, I may want you to consider treatment with CPAP, as treatment of even borderline or mild sleep apnea can result and improvement of symptoms such as sleep disruption, daytime sleepiness, nighttime bathroom breaks, restless leg symptoms, improvement of headache syndromes, even improved mood disorder.   As explained, an attended sleep study (meaning you get to stay overnight in the sleep lab), lets us  monitor sleep-related behaviors such as sleep talking and leg movements in sleep, in addition to monitoring for sleep apnea.  A home sleep test is a screening tool for sleep apnea diagnosis only, but unfortunately, does not help with any other sleep-related diagnoses.  Please remember, the long-term risks and ramifications of untreated moderate to severe obstructive sleep apnea may include (but are not limited to): increased risk for cardiovascular disease, including congestive heart failure, stroke, difficult to control hypertension, treatment resistant obesity, arrhythmias, especially irregular heartbeat commonly known as A. Fib. (atrial fibrillation); even type 2 diabetes has been linked to untreated OSA.   Other correlations that untreated obstructive sleep apnea include macular edema which is swelling of the retina in the eyes, droopy eyelid syndrome, and elevated hemoglobin and hematocrit levels (often referred to as  polycythemia).  Sleep apnea can cause disruption of sleep and sleep deprivation in most cases, which, in turn, can cause recurrent headaches, problems with memory, mood, concentration, focus, and vigilance. Most people with untreated sleep apnea report excessive daytime sleepiness, which can affect their ability to drive. Please do not drive or use heavy equipment or machinery, if you feel sleepy! Based on your sleepiness and fatigue scores today, yoj may be at risk of falling asleep at the wheel.   Patients with sleep apnea can also develop difficulty initiating and maintaining sleep (aka insomnia).   Having sleep apnea may increase your risk for other sleep disorders, including involuntary behaviors sleep such as sleep terrors, sleep talking, sleepwalking.    Having sleep apnea can also increase your risk for restless leg syndrome and leg movements at night.   Please note that untreated obstructive sleep apnea may carry additional perioperative morbidity. Patients with significant obstructive sleep apnea (typically, in the moderate to severe degree) should receive, if possible, perioperative PAP (positive airway pressure) therapy and the surgeons and particularly the anesthesiologists should be informed of the diagnosis and the severity of the sleep disordered breathing.   We will call you or email you through MyChart with regards to your test results and plan a follow-up in sleep clinic accordingly. Most likely, you will hear from one of our nurses.   Our sleep lab administrative assistant will call you to schedule your sleep study and give you further instructions, regarding the check in process for the sleep study, arrival time, what to bring, when you can expect to leave after the study, etc., and to answer any  other logistical questions you may have. If you don't hear back from her by about 2 weeks from now, please feel free to call her direct line at [phone] or you can call our general  clinic number, or email us  through My Chart.

## 2023-12-29 NOTE — Telephone Encounter (Signed)
 Please advise patient that it not urgent and that I prefer she meet with PCP to discuss her palpitations. They can initiate some w/u and consider a referral to cardiology if they deem necessary.

## 2023-12-29 NOTE — Telephone Encounter (Signed)
 Patient prefers Dr. Buck send a referral for Cardiology because she doesn't have an upcoming appointment  with her PCP. If this can't be done please contact the patient to advise.

## 2023-12-29 NOTE — Progress Notes (Signed)
 Subjective:    Patient ID: Cynthia Heath is a 43 y.o. female.  HPI    True Mar, MD, PhD [facility] [address] [address] [place], [place] [address]   Dear Cynthia Heath,  I saw your patient, Cynthia Heath, upon your kind request in my sleep clinic today for initial consultation of her sleep disorder, in particular, concern for underlying obstructive sleep apnea.  The patient is unaccompanied today.  As you know, [name] is a 43 year old female with an underlying medical history of reflux disease, anxiety, neck pain, allergies, and obesity, who reports snoring and excessive daytime somnolence, as well as witnessed apneas per family.  Her Epworth sleepiness score is 20 out of 24, fatigue severity score is 60 out of 63.  She does endorse sleepiness at the wheel.  She has to drive to [place] for work.  She works for a nonprofit.  She goes to bed generally around 10 and rise time is around 6.  She has woken up with a sense of gasping for air and palpitations.  She has not addressed her palpitations with you yet.  She limits her caffeine to 1 cup of coffee or 1 cup of tea per day.  She drinks alcohol occasionally.  She is a non-smoker.  She is not aware of any family history of sleep apnea.  She has 3 children, her oldest daughter is grown and on her own.  She has a TV in her bedroom, it is typically not on at night.  She has nocturia about once or twice per average night and has woken up occasionally with a headache.  She is working on weight loss.  She was on Wegovy but she did not find it effective.  Her Past Medical History Is Significant For: Past Medical History:  Diagnosis Date   Preterm labor     Her Past Surgical History Is Significant For: Past Surgical History:  Procedure Laterality Date   AUGMENTATION MAMMAPLASTY Bilateral    NO PAST SURGERIES      Her Family History Is Significant For: Family History  Problem Relation Age of Onset    Diabetes Mother     Her Social History Is Significant For: Social History   Socioeconomic History   Marital status: Divorced    Spouse name: Not on file   Number of children: Not on file   Years of education: Not on file   Highest education level: Not on file  Occupational History   Not on file  Tobacco Use   Smoking status: Never   Smokeless tobacco: Never  Vaping Use   Vaping status: Never Used  Substance and Sexual Activity   Alcohol use: No   Drug use: No   Sexual activity: Yes    Birth control/protection: None  Other Topics Concern   Not on file  Social History Narrative   Not on file   Social Drivers of Health   Financial Resource Strain: Not on file  Food Insecurity: Not on file  Transportation Needs: Not on file  Physical Activity: Not on file  Stress: Not on file  Social Connections: Unknown (07/05/2021)   Received from [facility]   Social Network    Social Network: Not on file    Her Allergies Are:  No Known Allergies:   Her Current Medications Are:  Outpatient Encounter Medications as of 12/29/2023  Medication Sig   celecoxib (CELEBREX) 200 MG capsule Take 200 mg by mouth daily.  cyclobenzaprine  (FLEXERIL ) 10 MG tablet Take 1 tablet (10 mg total) by mouth 2 (two) times daily as needed for muscle spasms.   diazepam  (VALIUM ) 5 MG tablet Take 1 tablet (5 mg total) by mouth every 6 (six) hours as needed for muscle spasms (spasms).   Ferrous Sulfate  (IRON PO) Take 1 Dose by mouth daily.   fluticasone (FLONASE) 50 MCG/ACT nasal spray Place 2 sprays into both nostrils daily.   ibuprofen  (ADVIL ) 800 MG tablet Take 1 tablet (800 mg total) by mouth every 8 (eight) hours as needed for moderate pain.   Multiple Vitamin (MULTIVITAMIN) tablet Take 1 tablet by mouth daily.   OVER THE COUNTER MEDICATION Take 1 Dose by mouth daily. COVID vitamin from [employer]   acetaminophen  (TYLENOL ) 500 MG tablet Take 1,000 mg by mouth every 6 (six) hours as needed for mild pain or  moderate pain. (Patient not taking: Reported on 12/29/2023)   aspirin EC 81 MG tablet Take 81 mg by mouth daily as needed for mild pain or moderate pain. (Patient not taking: Reported on 12/29/2023)   MELATONIN PO Take 1 tablet by mouth at bedtime as needed (sleep). (Patient not taking: Reported on 12/29/2023)   OVER THE COUNTER MEDICATION Take 1 Dose by mouth at bedtime as needed (sleep). OTC Sleep Aid (Patient not taking: Reported on 12/29/2023)   WEGOVY 1 MG/0.5ML SOAJ SQ injection Inject 1 mg into the skin once a week. (Patient not taking: Reported on 12/29/2023)   No facility-administered encounter medications on file as of 12/29/2023.  :   Review of Systems:  Out of a complete 14 point review of systems, all are reviewed and negative with the exception of these symptoms as listed below:   Review of Systems  Objective:  Neurological Exam  Physical Exam Physical Examination:   Vitals:   12/29/23 0929  BP: 101/70  Pulse: 95    General Examination: The patient is a very pleasant 43 y.o. female in no acute distress. She appears well-developed and well-nourished and well groomed.   HEENT: Normocephalic, atraumatic, pupils are equal, round and reactive to light, extraocular tracking is good without limitation to gaze excursion or nystagmus noted. No photophobia.  Hearing is grossly intact.  Face is symmetric with normal facial animation. Speech is clear without dysarthria. There is no hypophonia. There is no lip, neck/head, jaw or voice tremor. Neck is supple with full range of passive and active motion. There are no carotid bruits on auscultation.  Airway/Oropharynx exam reveals: mild mouth dryness, good dental hygiene and moderate airway crowding, due to small airway entry, prominent uvula, tonsillar size of about 2+ bilaterally.  Tongue protrudes centrally and palate elevates symmetrically.  Minimal to mild overbite.  Neck circumference 13-5/8 inches.  Chest: Clear to  auscultation without wheezing, rhonchi or crackles noted.  Heart: S1+S2+0, regular and normal without murmurs, rubs or gallops noted.   Abdomen: Soft, non-tender and non-distended.  Extremities: There is no pitting edema in the distal lower extremities bilaterally.   Skin: Warm and dry without trophic changes noted.   Musculoskeletal: exam reveals no obvious joint deformities.   Neurologically:  Mental status: The patient is awake, alert and oriented in all 4 spheres. Her immediate and remote memory, attention, language skills and fund of knowledge are appropriate. There is no evidence of aphasia, agnosia, apraxia or anomia. Speech is clear with normal prosody and enunciation. Thought process is linear. Mood is normal and affect is normal.  Cranial nerves II - XII are as  described above under HEENT exam.  Motor exam: Normal bulk, moving all 4 extremities without restriction, no obvious action or resting tremor.  Fine motor skills and coordination: Intact grossly.  Cerebellar testing: No dysmetria or intention tremor. There is no truncal or gait ataxia.  Sensory exam: intact to light touch in the upper and lower extremities.  Gait, station and balance: She stands easily. No veering to one side is noted. No leaning to one side is noted. Posture is age-appropriate and stance is narrow based. Gait shows normal stride length and normal pace. No problems turning are noted.   Assessment and Plan:   In summary, Cynthia Heath is a very pleasant 43 y.o.-year old female with an underlying medical history of reflux disease, anxiety, neck pain, allergies, and obesity, whose history and physical exam are concerning for sleep disordered breathing, particularly obstructive sleep apnea (OSA). A laboratory attended sleep study is typically considered gold standard for evaluation of sleep disordered breathing.   I had a long chat with the patient about my findings and the diagnosis of sleep apnea,  particularly OSA, its prognosis and treatment options. We talked about medical/conservative treatments, surgical interventions and non-pharmacological approaches for symptom control. I explained, in particular, the risks and ramifications of untreated moderate to severe OSA, especially with respect to developing cardiovascular disease down the road, including congestive heart failure (CHF), difficult to treat hypertension, cardiac arrhythmias (particularly A-fib), neurovascular complications including TIA, stroke and dementia. Even type 2 diabetes has, in part, been linked to untreated OSA. Symptoms of untreated OSA may include (but may not be limited to) daytime sleepiness, nocturia (i.e. frequent nighttime urination), memory problems, mood irritability and suboptimally controlled or worsening mood disorder such as depression and/or anxiety, lack of energy, lack of motivation, physical discomfort, as well as recurrent headaches, especially morning or nocturnal headaches. We talked about the importance of maintaining a healthy lifestyle and striving for healthy weight. In addition, we talked about the importance of striving for and maintaining good sleep hygiene. She is advised to talk to you about her nocturnal palpitations and further evaluation including a possible referral to cardiology.  If she has obstructive sleep apnea, she may be a candidate for Zepbound.  She is encouraged to address this with you after testing. I recommended a sleep study at this time. I outlined the differences between a laboratory attended sleep study which is considered more comprehensive and accurate over the option of a home sleep test (HST); the latter may lead to underestimation of sleep disordered breathing in some instances and does not help with diagnosing upper airway resistance syndrome and is not accurate enough to diagnose primary central sleep apnea typically. I outlined possible surgical and non-surgical treatment  options of OSA, including the use of a positive airway pressure (PAP) device (i.e. CPAP, AutoPAP/APAP or BiPAP in certain circumstances), a custom-made dental device (aka oral appliance, which would require a referral to a specialist dentist or orthodontist typically, and is generally speaking not considered for patients with full dentures or edentulous state), upper airway surgical options, such as traditional UPPP (which is not considered a first-line treatment) or the Inspire device (hypoglossal nerve stimulator, which would involve a referral for consultation with an ENT surgeon, after careful selection, following inclusion criteria - also not first-line treatment). I explained the PAP treatment option to the patient in detail, as this is generally considered first-line treatment.  The patient indicated that she would be willing to try PAP therapy, if the need  arises. I explained the importance of being compliant with PAP treatment, not only for insurance purposes but primarily to improve patient's symptoms symptoms, and for the patient's long term health benefit, including to reduce Her cardiovascular risks longer-term.    We will pick up our discussion about the next steps and treatment options after testing.  We will keep her posted as to the test results by phone call and/or MyChart messaging where possible.  We will plan to follow-up in sleep clinic accordingly as well.  I answered all her questions today and the patient was in agreement.   I encouraged her to call with any interim questions, concerns, problems or updates or email us  through MyChart.  Generally speaking, sleep test authorizations may take up to 2 weeks, sometimes less, sometimes longer, the patient is encouraged to get in touch with us  if they do not hear back from the sleep lab staff directly within the next 2 weeks.  Thank you very much for allowing me to participate in the care of this nice patient. If I can be of any further  assistance to you please do not hesitate to call me at [phone].  Sincerely,   True Mar, MD, PhD

## 2024-01-01 NOTE — Telephone Encounter (Signed)
 Called and left information on pt's VM. Ok to do so per FISERV.

## 2024-01-08 ENCOUNTER — Ambulatory Visit
Admission: RE | Admit: 2024-01-08 | Discharge: 2024-01-08 | Disposition: A | Discharge status: Home / Self Care | Source: Ambulatory Visit | Attending: Physician Assistant | Admitting: Physician Assistant

## 2024-01-08 ENCOUNTER — Other Ambulatory Visit

## 2024-01-08 ENCOUNTER — Other Ambulatory Visit: Payer: Self-pay | Admitting: Physician Assistant

## 2024-01-08 ENCOUNTER — Ambulatory Visit
Admission: RE | Admit: 2024-01-08 | Discharge: 2024-01-08 | Disposition: A | Source: Ambulatory Visit | Attending: Physician Assistant | Admitting: Physician Assistant

## 2024-01-08 DIAGNOSIS — R928 Other abnormal and inconclusive findings on diagnostic imaging of breast: Secondary | ICD-10-CM

## 2024-01-08 DIAGNOSIS — R921 Mammographic calcification found on diagnostic imaging of breast: Secondary | ICD-10-CM

## 2024-01-10 ENCOUNTER — Ambulatory Visit
Admission: RE | Admit: 2024-01-10 | Discharge: 2024-01-10 | Disposition: A | Discharge status: Home / Self Care | Source: Ambulatory Visit | Attending: Physician Assistant | Admitting: Physician Assistant

## 2024-01-10 DIAGNOSIS — R921 Mammographic calcification found on diagnostic imaging of breast: Secondary | ICD-10-CM

## 2024-01-10 DIAGNOSIS — R928 Other abnormal and inconclusive findings on diagnostic imaging of breast: Secondary | ICD-10-CM

## 2024-01-10 HISTORY — PX: BREAST BIOPSY: SHX20

## 2024-01-11 ENCOUNTER — Telehealth: Payer: Self-pay | Admitting: Neurology

## 2024-01-11 NOTE — Telephone Encounter (Signed)
 NPSG [facility] blue pending

## 2024-01-15 LAB — SURGICAL PATHOLOGY

## 2024-01-15 NOTE — Telephone Encounter (Signed)
Checked status it is still pending.  

## 2024-01-18 NOTE — Telephone Encounter (Signed)
 HST [facility] blue pending- NPSG denied see below for the denial reason.

## 2024-01-22 NOTE — Telephone Encounter (Signed)
 HST [facility] blue on auth req via fax

## 2024-02-07 ENCOUNTER — Ambulatory Visit: Admitting: Neurology

## 2024-02-07 DIAGNOSIS — R002 Palpitations: Secondary | ICD-10-CM

## 2024-02-07 DIAGNOSIS — R351 Nocturia: Secondary | ICD-10-CM

## 2024-02-07 DIAGNOSIS — R0683 Snoring: Secondary | ICD-10-CM

## 2024-02-07 DIAGNOSIS — E66811 Obesity, class 1: Secondary | ICD-10-CM

## 2024-02-07 DIAGNOSIS — R519 Headache, unspecified: Secondary | ICD-10-CM

## 2024-02-07 DIAGNOSIS — G4719 Other hypersomnia: Secondary | ICD-10-CM

## 2024-02-07 DIAGNOSIS — Z9189 Other specified personal risk factors, not elsewhere classified: Secondary | ICD-10-CM

## 2024-02-07 DIAGNOSIS — G4733 Obstructive sleep apnea (adult) (pediatric): Secondary | ICD-10-CM

## 2024-02-07 DIAGNOSIS — R0681 Apnea, not elsewhere classified: Secondary | ICD-10-CM

## 2024-02-07 DIAGNOSIS — R0689 Other abnormalities of breathing: Secondary | ICD-10-CM

## 2024-02-08 NOTE — Progress Notes (Unsigned)
 SABRA

## 2024-02-12 ENCOUNTER — Ambulatory Visit: Payer: Self-pay | Admitting: Neurology

## 2024-02-12 NOTE — Procedures (Signed)
   [facility]  HOME SLEEP TEST (Watch PAT) REPORT  STUDY DATE: 02/07/2024  DOB: 1980-06-16  MRN: [id number]  ORDERING CLINICIAN: True Mar, MD, PhD   REFERRING CLINICIAN: Dorthea Ponder, PA  CLINICAL INFORMATION/HISTORY (obtained from visit note dated 12/29/2023): 43 year old female with an underlying medical history of reflux disease, anxiety, neck pain, allergies, and obesity, who reports snoring and excessive daytime somnolence, as well as witnessed apneas.  Epworth sleepiness score: 20/24.  BMI: 32.1 kg/m  FINDINGS:   Sleep Summary:   Total Recording Time (hours, min): 12 hours, 0 min  Total Sleep Time (hours, min):  10 hours, 43 min  Percent REM (%):    20%   Respiratory Indices:   Calculated pAHI (per hour):  3.4/hour         REM pAHI:    33.8/hour       NREM pAHI: 0.9/hour  Central pAHI: 0/hour  Oxygen Saturation Statistics:    Oxygen Saturation (%) Mean: 94%   Minimum oxygen saturation (%):                 86%   O2 Saturation Range (%): 86-98%    O2 Saturation (minutes) <=88%: 0.1 min  Pulse Rate Statistics:   Pulse Mean (bpm):    79/min    Pulse Range (62-104/min)   IMPRESSION: Primary snoring   RECOMMENDATION:  This home sleep test does not demonstrate any significant obstructive or central sleep disordered breathing with a total AHI of less than 5/hour. Her total AHI was 3.4/hour, O2 nadir of 86% without any significant time below or at 88% saturation for the night (less than 1 minute).  Snoring was detected, intermittently, ranging from mild to louder. Treatment with a positive airway pressure device such as AutoPap or CPAP is not indicated based on this test. Snoring may improve with avoidance of the supine sleep position and weight loss (where clinically appropriate).   For disturbing snoring, an oral appliance through dentistry or orthodontics can be considered.  Other causes of the patient's symptoms, including  circadian rhythm disturbances, an underlying mood disorder, medication effect and/or an underlying medical problem cannot be ruled out based on this test. Clinical correlation is recommended.  The patient should be cautioned not to drive, work at heights, or operate dangerous or heavy equipment when tired or sleepy. Review and reiteration of good sleep hygiene measures should be pursued with any patient. The patient will be advised to follow up with her referring provider, who will be notified of the test results.   I certify that I have reviewed the raw data recording prior to the issuance of this report in accordance with the standards of the American Academy of Sleep Medicine (AASM).    INTERPRETING PHYSICIAN:   True Mar, MD, PhD Medical Director, [facility]) Diplomat, ABPN (Neurology and Sleep)   [facility] [address] [place], [place] [address] [phone]

## 2024-02-26 NOTE — Progress Notes (Signed)
 "  Chief Complaint: GERD and nausea  HPI:    Cynthia Heath is a 43 year old female with a past medical history as listed below as well as obesity, anxiety disorder and prediabetes, who was referred to me by [facility] for a complaint of GERD and nausea.     01/12/2024 patient seen by PCP and at that time discussed reflux.  She was referred to us  and continued on Omeprazole twice daily.    02/20/2024 patient saw cardiology for palpitations.  EKG with sinus rhythm and mild nonspecific ST changes.  At that time was thought possibly anxiety related.  Echo was ordered.    02/23/24 Patient saw cardiology at [facility] for Echo. No results available.     Today, the patient tells me she has had reflux and epigastric pain as well as occasional episodes of regurgitation that wake her from her sleep for over 10+ years.  Tells me it does not seem to matter what she eats but she knows if she eats and lays flat she will certainly have issues.  Alcohol also seems to make it worse.  She has always eaten the Tums like the candy and taken Omeprazole 20 mg daily for years, but continues with symptoms.  Tells me she just does not feel like she is digesting well.    Also discusses constipation and bloating, occasionally takes over-the-counter laxatives in order to help with this.    Also describes some palpitations worked up by cardiology, they think it may be anxiety.  She had an echo on the 19th which has not resulted yet.  Has yet to follow-up with cardiology.    Also worried about weight gain over the years.    Patient has 3 beautiful daughters.    Denies fever, chills, weight loss or abdominal pain.  Past Medical History:  Diagnosis Date   Preterm labor     Past Surgical History:  Procedure Laterality Date   AUGMENTATION MAMMAPLASTY Bilateral    BREAST BIOPSY Left 01/10/2024   MM LT BREAST BX W LOC DEV 1ST LESION IMAGE BX SPEC STEREO GUIDE 01/10/2024 [facility]   NO PAST  SURGERIES      Current Outpatient Medications  Medication Sig Dispense Refill   acetaminophen  (TYLENOL ) 500 MG tablet Take 1,000 mg by mouth every 6 (six) hours as needed for mild pain or moderate pain. (Patient not taking: Reported on 12/29/2023)     aspirin EC 81 MG tablet Take 81 mg by mouth daily as needed for mild pain or moderate pain. (Patient not taking: Reported on 12/29/2023)     celecoxib (CELEBREX) 200 MG capsule Take 200 mg by mouth daily.     cyclobenzaprine  (FLEXERIL ) 10 MG tablet Take 1 tablet (10 mg total) by mouth 2 (two) times daily as needed for muscle spasms. 20 tablet 0   diazepam  (VALIUM ) 5 MG tablet Take 1 tablet (5 mg total) by mouth every 6 (six) hours as needed for muscle spasms (spasms). 5 tablet 0   Ferrous Sulfate  (IRON PO) Take 1 Dose by mouth daily.     fluticasone (FLONASE) 50 MCG/ACT nasal spray Place 2 sprays into both nostrils daily.     ibuprofen  (ADVIL ) 800 MG tablet Take 1 tablet (800 mg total) by mouth every 8 (eight) hours as needed for moderate pain. 21 tablet 0   MELATONIN PO Take 1 tablet by mouth at bedtime as needed (sleep). (Patient not taking: Reported on 12/29/2023)     Multiple Vitamin (  MULTIVITAMIN) tablet Take 1 tablet by mouth daily.     OVER THE COUNTER MEDICATION Take 1 Dose by mouth daily. COVID vitamin from [employer]     OVER THE COUNTER MEDICATION Take 1 Dose by mouth at bedtime as needed (sleep). OTC Sleep Aid (Patient not taking: Reported on 12/29/2023)     WEGOVY 1 MG/0.5ML SOAJ SQ injection Inject 1 mg into the skin once a week. (Patient not taking: Reported on 12/29/2023)     No current facility-administered medications for this visit.    Allergies as of 02/28/2024   (No Known Allergies)    Family History  Problem Relation Age of Onset   Diabetes Mother     Social History   Socioeconomic History   Marital status: Divorced    Spouse name: Not on file   Number of children: Not on file   Years of education: Not on file    Highest education level: Not on file  Occupational History   Not on file  Tobacco Use   Smoking status: Never   Smokeless tobacco: Never  Vaping Use   Vaping status: Never Used  Substance and Sexual Activity   Alcohol use: No   Drug use: No   Sexual activity: Yes    Birth control/protection: None  Other Topics Concern   Not on file  Social History Narrative   Not on file   Social Drivers of Health   Tobacco Use: Low Risk (12/29/2023)   Patient History    Smoking Tobacco Use: Never    Smokeless Tobacco Use: Never    Passive Exposure: Not on file  Financial Resource Strain: Medium Risk (02/06/2024)   Received from [facility]   Overall Financial Resource Strain (CARDIA)    How hard is it for you to pay for the very basics like food, housing, medical care, and heating?: Somewhat hard  Food Insecurity: Food Insecurity Present (02/06/2024)   Received from [facility]   Epic    Within the past 12 months, you worried that your food would run out before you got the money to buy more.: Never true    Within the past 12 months, the food you bought just didn't last and you didn't have money to get more.: Sometimes true  Transportation Needs: No Transportation Needs (02/06/2024)   Received from [facility]    In the past 12 months, has lack of transportation kept you from medical appointments or from getting medications?: No    In the past 12 months, has lack of transportation kept you from meetings, work, or from getting things needed for daily living?: No  Physical Activity: Insufficiently Active (02/06/2024)   Received from [facility]   Exercise Vital Sign    On average, how many days per week do you engage in moderate to strenuous exercise (like a brisk walk)?: 2 days    On average, how many minutes do you engage in exercise at this level?: 30 min  Stress: Stress Concern Present (02/06/2024)   Received from [facility]  Occupational Stress Questionnaire    Do you feel stress - tense, restless, nervous, or anxious, or unable to sleep at night because your mind is troubled all the time - these days?: Very much  Social Connections: Moderately Integrated (02/06/2024)   Received from [facility]   Social Network    How would you rate your social network (family, work, friends)?: Adequate participation with social  networks  Intimate Partner Violence: Not At Risk (02/06/2024)   Received from [facility]   HITS    Over the last 12 months how often did your partner physically hurt you?: Never    Over the last 12 months how often did your partner insult you or talk down to you?: Never    Over the last 12 months how often did your partner threaten you with physical harm?: Never    Over the last 12 months how often did your partner scream or curse at you?: Never  Depression (PHQ2-9): Not on file  Alcohol Screen: Not on file  Housing: Low Risk (02/06/2024)   Received from [facility]    In the last 12 months, was there a time when you were not able to pay the mortgage or rent on time?: No    In the past 12 months, how many times have you moved where you were living?: 0    At any time in the past 12 months, were you homeless or living in a shelter (including now)?: No  Utilities: Not At Risk (02/06/2024)   Received from [facility]    In the past 12 months has the electric, gas, oil, or water company threatened to shut off services in your home?: No  Health Literacy: Not on file    Review of Systems:    Constitutional: No weight loss, fever or chills Skin: No rash  Cardiovascular: See HPI Respiratory: No SOB  Gastrointestinal: See HPI and otherwise negative Genitourinary: No dysuria Neurological: No headache, dizziness or syncope Musculoskeletal: No new muscle or joint pain Hematologic: No bleeding  Psychiatric: No history of depression   Physical Exam:  Vital signs: BP 112/82 (BP  Location: Right Arm, Patient Position: Sitting, Cuff Size: Normal)   Pulse 92   Ht 5' 6 (1.676 m)   Wt 196 lb (88.9 kg)   BMI 31.64 kg/m   Constitutional:   Pleasant AA female appears to be in NAD, Well developed, Well nourished, alert and cooperative Head:  Normocephalic and atraumatic. Eyes:   PEERL, EOMI. No icterus. Conjunctiva pink. Ears:  Normal auditory acuity. Neck:  Supple Throat: Oral cavity and pharynx without inflammation, swelling or lesion.  Respiratory: Respirations even and unlabored. Lungs clear to auscultation bilaterally.   No wheezes, crackles, or rhonchi.  Cardiovascular: Normal S1, S2. No MRG. Regular rate and rhythm. No peripheral edema, cyanosis or pallor.  Gastrointestinal:  Soft, nondistended, nontender. No rebound or guarding. Normal bowel sounds. No appreciable masses or hepatomegaly. Rectal:  Not performed.  Msk:  Symmetrical without gross deformities. Without edema, no deformity or joint abnormality.  Neurologic:  Alert and  oriented x4;  grossly normal neurologically.  Skin:   Dry and intact without significant lesions or rashes. Psychiatric: Demonstrates good judgement and reason without abnormal affect or behaviors.  No recent labs.   Assessment: 1.  GERD and nausea: For 10+ years uncontrolled on Omeprazole 20 mg daily, describes epigastric pain with nocturnal regurgitation and episodes of nausea with vomiting, worsened by foods, specifically alcohol but otherwise does not seem to matter; likely gastritis +/- GERD and esophagitis +/- PUD +/- H. pylori 2.  Epigastric pain 3.  Palpitations: Patient recently followed cardiology about this, EKG was borderline abnormal and sent for an echo, she had this on 12/19 but has not followed up with them yet, per their notes they think may be anxiety related 4.  Constipation: Chronic for the patient, sometimes  she takes over-the-counter laxatives in order to help; consider likely IBS with history of possible  anxiety  Plan: 1.  Scheduled patient for diagnostic EGD in the [facility] with Dr. Legrand.  Did provide the patient with a detailed list of risks for the procedure and she agrees to proceed. Patient is appropriate for endoscopic procedure(s) in the ambulatory ([facility]) setting.  2.  Started the patient on Pantoprazole  40 mg twice daily, 30-60 minutes before breakfast and dinner.  #60 with 5 refills 3.  Discontinue Omeprazole 4.  Discussed antireflux diet and lifestyle modifications. 5.  Briefly discussed GLP-1 medicines with the patient.  She is trying to get Zepbound approved.  Discussed how these works and how it may worsen her symptoms 6.  Discussed constipation, recommend fiber water and exercise and MiraLAX daily. 7.  Patient to follow in clinic per recommendations from Dr. Legrand after time of procedure.  Delon Failing, PA-C [facility] 02/26/2024, 11:37 AM  Cc: [facility]  "

## 2024-02-28 ENCOUNTER — Telehealth: Payer: Self-pay | Admitting: *Deleted

## 2024-02-28 ENCOUNTER — Ambulatory Visit: Admitting: Physician Assistant

## 2024-02-28 ENCOUNTER — Encounter: Payer: Self-pay | Admitting: Physician Assistant

## 2024-02-28 VITALS — BP 112/82 | HR 92 | Ht 66.0 in | Wt 196.0 lb

## 2024-02-28 DIAGNOSIS — K219 Gastro-esophageal reflux disease without esophagitis: Secondary | ICD-10-CM

## 2024-02-28 DIAGNOSIS — R1013 Epigastric pain: Secondary | ICD-10-CM

## 2024-02-28 DIAGNOSIS — R002 Palpitations: Secondary | ICD-10-CM

## 2024-02-28 DIAGNOSIS — R110 Nausea: Secondary | ICD-10-CM

## 2024-02-28 DIAGNOSIS — K5900 Constipation, unspecified: Secondary | ICD-10-CM

## 2024-02-28 DIAGNOSIS — K5909 Other constipation: Secondary | ICD-10-CM | POA: Diagnosis not present

## 2024-02-28 MED ORDER — PANTOPRAZOLE SODIUM 40 MG PO TBEC: 40.0000 mg | DELAYED_RELEASE_TABLET | Freq: Two times a day (BID) | ORAL | 3 refills | Status: AC

## 2024-02-28 NOTE — Telephone Encounter (Signed)
 [facility] Pre-operative Risk Assessment     Request for surgical clearance:     Endoscopy Procedure  What type of surgery is being performed?     EGD  When is this surgery scheduled?     03/14/24  What type of clearance is required ?   Cardiac  Practice name and name of physician performing surgery?      [facility]  What is your office phone and fax number?      Phone- [phone]  Fax- [phone]  Anesthesia type (None, local, MAC, general) ?       MAC   Please route your response to Powell Misty, CMA

## 2024-02-28 NOTE — Patient Instructions (Signed)
 You have been scheduled for an endoscopy. Please follow written instructions given to you at your visit today.  If you use inhalers (even only as needed), please bring them with you on the day of your procedure.  If you take any of the following medications, they will need to be adjusted prior to your procedure:   DO NOT TAKE 7 DAYS PRIOR TO TEST- Trulicity (dulaglutide) Ozempic, Wegovy (semaglutide) Mounjaro, Zepbound (tirzepatide) Bydureon Bcise (exanatide extended release)  DO NOT TAKE 1 DAY PRIOR TO YOUR TEST Rybelsus (semaglutide) Adlyxin (lixisenatide) Victoza (liraglutide) Byetta (exanatide) ___________________________________________________________________________   We have sent the following medications to your pharmacy for you to pick up at your convenience: Pantoprazole  40 mg Take twice daily  _______________________________________________________  If your blood pressure at your visit was 140/90 or greater, please contact your primary care physician to follow up on this.  _______________________________________________________  If you are age 42 or older, your body mass index should be between 23-30. Your Body mass index is 31.64 kg/m. If this is out of the aforementioned range listed, please consider follow up with your Primary Care Provider.  If you are age 3 or younger, your body mass index should be between 19-25. Your Body mass index is 31.64 kg/m. If this is out of the aformentioned range listed, please consider follow up with your Primary Care Provider.   ________________________________________________________  The [facility] providers would like to encourage you to use MYCHART to communicate with providers for non-urgent requests or questions.  Due to long hold times on the telephone, sending your provider a message by [facility] may be a faster and more efficient way to get a response.  Please allow 48 business hours for a response.  Please remember that  this is for non-urgent requests.  _______________________________________________________  [facility] is using a team-based approach to care.  Your team is made up of your doctor and two to three APPS. Our APPS (Nurse Practitioners and Physician Assistants) work with your physician to ensure care continuity for you. They are fully qualified to address your health concerns and develop a treatment plan. They communicate directly with your gastroenterologist to care for you. Seeing the Advanced Practice Practitioners on your physician's team can help you by facilitating care more promptly, often allowing for earlier appointments, access to diagnostic testing, procedures, and other specialty referrals.   Due to recent changes in healthcare laws, you may see the results of your imaging and laboratory studies on MyChart before your provider has had a chance to review them.  We understand that in some cases there may be results that are confusing or concerning to you. Not all laboratory results come back in the same time frame and the provider may be waiting for multiple results in order to interpret others.  Please give us  48 hours in order for your provider to thoroughly review all the results before contacting the office for clarification of your results.

## 2024-02-28 NOTE — Telephone Encounter (Signed)
 Clearance faxed to correct office. See new telephone contact.

## 2024-02-28 NOTE — Telephone Encounter (Signed)
Clearance faxed via Epic.  

## 2024-02-28 NOTE — Telephone Encounter (Signed)
 Incoming call form [facility]. Stated the pt was not seen with the practice. May be a pt of [facility] with Dr Marcello Lennox.

## 2024-02-28 NOTE — Progress Notes (Signed)
 ____________________________________________________________  Attending physician addendum:  Thank you for sending this case to me. I have reviewed the entire note and agree with the plan.  Please check on the results of her echocardiogram by 03/06/2024 since her procedure scheduled for January 8. Thank you  Victory Brand, MD  ____________________________________________________________

## 2024-02-28 NOTE — Telephone Encounter (Signed)
" ° ° °  Primary Cardiologist:None  Chart reviewed as part of pre-operative protocol coverage. Because of Cynthia Heath past medical history and time since last visit, he/she will require a follow-up office  visit in order to better assess preoperative cardiovascular risk.  Pre-op covering staff: - Please schedule appointment and call patient to inform them. - Please contact requesting surgeon's office via preferred method (i.e, phone, fax) to inform them of need for appointment prior to surgery.  If applicable, this message will also be routed to pharmacy pool and/or primary cardiologist for input on holding anticoagulant/antiplatelet agent as requested below so that this information is available at time of patient's appointment.   Josefa CHRISTELLA Beauvais, NP  02/28/2024, 10:39 AM   "

## 2024-02-28 NOTE — Telephone Encounter (Signed)
 Patient has never been seen in our office but patient recent had an appointment with another cardiology office called requesting office aware and they voiced understanding are going to forward clearance to that office and make sure with patient if that is who they follow up with

## 2024-03-08 NOTE — Telephone Encounter (Signed)
 Patient scheduled with Dr. Uvaldo office 03/11/24

## 2024-03-11 NOTE — Telephone Encounter (Deleted)
FYI: update on patient.  

## 2024-03-11 NOTE — Telephone Encounter (Deleted)
 Patient informed we needed to reschedule her procedure on 03/14/24. Patient will speak with cardiology and call back to reschedule procedure once echocardiogram is completed.

## 2024-03-11 NOTE — Telephone Encounter (Signed)
 Received clearance from Dr. Uvaldo, patient is low risk and stable for procedure. Clearance sent to be scanned into chart.

## 2024-03-11 NOTE — Telephone Encounter (Addendum)
 Patient has completed the echocardiogram and per Erminio patient was going to be cleared for procedure. Will have clearance faxed to our office tomorrow. Patient was rescheduled for her procedure on 03/14/24 @ 7:30 am

## 2024-03-13 NOTE — Telephone Encounter (Signed)
 For documentation purposes, echocardiogram results:  Patient's echo results are in her chart under care everywhere. Under the office visit with Dr. Uvaldo office visit from 02/20/24.   Imaging Results - Echocardiogram Complete WO Enhancing Agent (02/23/2024 2:50 PM EST)  Impressions  03/03/2024 1:56 PM EST  Left Ventricle: Left ventricle size is normal. Systolic function is  normal. EF: 55-60%. Doppler parameters consistent with mild diastolic  dysfunction and low to normal LA pressure.    Left Atrium: Left atrium size is normal.    Right Ventricle: Right ventricle size is normal. Systolic function is  normal.   Tricuspid Valve: The right ventricular systolic pressure is normal (<36  mmHg).   Mitral Valve: There is mild regurgitation.  ________________  H. Legrand, MD

## 2024-03-14 ENCOUNTER — Encounter: Admitting: Gastroenterology

## 2024-03-14 ENCOUNTER — Ambulatory Visit: Admitting: Gastroenterology

## 2024-03-14 ENCOUNTER — Encounter: Payer: Self-pay | Admitting: Gastroenterology

## 2024-03-14 VITALS — BP 108/71 | HR 102 | Temp 98.2°F | Resp 12 | Ht 66.0 in | Wt 196.0 lb

## 2024-03-14 DIAGNOSIS — K449 Diaphragmatic hernia without obstruction or gangrene: Secondary | ICD-10-CM

## 2024-03-14 DIAGNOSIS — K2100 Gastro-esophageal reflux disease with esophagitis, without bleeding: Secondary | ICD-10-CM | POA: Diagnosis not present

## 2024-03-14 DIAGNOSIS — K2950 Unspecified chronic gastritis without bleeding: Secondary | ICD-10-CM

## 2024-03-14 DIAGNOSIS — B9681 Helicobacter pylori [H. pylori] as the cause of diseases classified elsewhere: Secondary | ICD-10-CM

## 2024-03-14 DIAGNOSIS — R1013 Epigastric pain: Secondary | ICD-10-CM

## 2024-03-14 DIAGNOSIS — K219 Gastro-esophageal reflux disease without esophagitis: Secondary | ICD-10-CM

## 2024-03-14 MED ORDER — SODIUM CHLORIDE 0.9 % IV SOLN: 500.0000 mL | Freq: Once | INTRAVENOUS | Status: DC

## 2024-03-14 NOTE — Op Note (Signed)
 [facility] Patient Name: Cynthia Heath Procedure Date: 03/14/2024 8:34 AM MRN: [id number] Endoscopist: Victory L. Legrand , MD, [id number] Age: 44 Referring MD:  Date of Birth: 11-Apr-1980 Gender: Female Account #: [id number] Procedure:                Upper GI endoscopy Indications:              Epigastric abdominal pain, Esophageal reflux                            symptoms that persist despite appropriate therapy                           see 02/28/24 clinic visit for further details Medicines:                Monitored Anesthesia Care Procedure:                Pre-Anesthesia Assessment:                           - Prior to the procedure, a History and Physical                            was performed, and patient medications and                            allergies were reviewed. The patient's tolerance of                            previous anesthesia was also reviewed. The risks                            and benefits of the procedure and the sedation                            options and risks were discussed with the patient.                            All questions were answered, and informed consent                            was obtained. Prior Anticoagulants: The patient has                            taken no anticoagulant or antiplatelet agents. ASA                            Grade Assessment: II - A patient with mild systemic                            disease. After reviewing the risks and benefits,                            the patient was deemed in satisfactory condition to  undergo the procedure.                           After obtaining informed consent, the endoscope was                            passed under direct vision. Throughout the                            procedure, the patient's blood pressure, pulse, and                            oxygen saturations were monitored continuously. The                            Olympus scope  [id number] was introduced through the                            mouth, and advanced to the second part of duodenum.                            The upper GI endoscopy was accomplished without                            difficulty. The patient tolerated the procedure                            well. Scope In: Scope Out: Findings:                 A 1 cm sliding hiatal hernia was present                            (intermittently evident with inspiration).                           LA Grade A (one or more mucosal breaks less than 5                            mm, not extending between tops of 2 mucosal folds)                            esophagitis with no bleeding was found at the                            gastroesophageal junction. (Single erosion in EGJ)                           The Z-line was variable ( < 1cm ).                           The exam of the esophagus was otherwise normal.                           Patchy mildly erythematous  mucosa was found in the                            gastric body. Several biopsies were obtained in the                            gastric body and in the gastric antrum with cold                            forceps for histology.                           The exam of the stomach was otherwise normal.                           The gastroesophageal flap valve was visualized                            endoscopically and classified as Hill Grade III                            (minimal fold, loose to endoscope, hiatal hernia                            likely).                           The examined duodenum was normal. Complications:            No immediate complications. Estimated Blood Loss:     Estimated blood loss was minimal. Impression:               - 1 cm hiatal hernia.                           - LA Grade A reflux esophagitis with no bleeding.                           - Z-line variable.                           - Erythematous mucosa in the gastric  body.                           - Gastroesophageal flap valve classified as Hill                            Grade III (minimal fold, loose to endoscope, hiatal                            hernia likely).                           - Normal examined duodenum.                           - Several biopsies  were obtained in the gastric                            body and in the gastric antrum. Recommendation:           - Patient has a contact number available for                            emergencies. The signs and symptoms of potential                            delayed complications were discussed with the                            patient. Return to normal activities tomorrow.                            Written discharge instructions were provided to the                            patient.                           - Resume previous diet.                           - Continue present medications.                           - Return to GI office PRN.                           - Follow-up pathology result                           - GERD related diet and lifestyle measures                            indefinitely                           - Patient is in process to resume GLP-1 agonist for                            weight loss. She has a comorbid condition of GERD                            with esophagitis despite acid suppression medicine.                            If significant weight loss can be achieved (with                            tolerable upper GI side effects of the medicines),  then GERD may significantly improved. If                            significant GERD symptoms persist despite weight                            loss, patient may then be a candidate for TIF + / -                            fundoplication. Mikeyla Music L. Legrand, MD 03/14/2024 8:57:04 AM This report has been signed electronically.

## 2024-03-14 NOTE — Progress Notes (Signed)
 No significant changes to clinical history since GI office visit on 02/28/24. See separate chart note for results of echocardiogram The patient is appropriate for an endoscopic procedure in the ambulatory setting.  - Victory Brand, MD

## 2024-03-14 NOTE — Progress Notes (Signed)
 Vss nad trans to pacu

## 2024-03-14 NOTE — Progress Notes (Signed)
 Called to room to assist during endoscopic procedure.  Patient ID and intended procedure confirmed with present staff. Received instructions for my participation in the procedure from the performing physician.

## 2024-03-14 NOTE — Patient Instructions (Signed)
 Please read handouts provided. Continue present medications. Resume previous diet. Await pathology results. Return to GI office as needed. GERD related diet and lifestyle measures indefinitely.   YOU HAD AN ENDOSCOPIC PROCEDURE TODAY AT THE [facility]:   Refer to the procedure report that was given to you for any specific questions about what was found during the examination.  If the procedure report does not answer your questions, please call your gastroenterologist to clarify.  If you requested that your care partner not be given the details of your procedure findings, then the procedure report has been included in a sealed envelope for you to review at your convenience later.  YOU SHOULD EXPECT: Some feelings of bloating in the abdomen. Passage of more gas than usual.  Walking can help get rid of the air that was put into your GI tract during the procedure and reduce the bloating. If you had a lower endoscopy (such as a colonoscopy or flexible sigmoidoscopy) you may notice spotting of blood in your stool or on the toilet paper. If you underwent a bowel prep for your procedure, you may not have a normal bowel movement for a few days.  Please Note:  You might notice some irritation and congestion in your nose or some drainage.  This is from the oxygen used during your procedure.  There is no need for concern and it should clear up in a day or so.  SYMPTOMS TO REPORT IMMEDIATELY:  Following upper endoscopy (EGD)  Vomiting of blood or coffee ground material  New chest pain or pain under the shoulder blades  Painful or persistently difficult swallowing  New shortness of breath  Fever of 100F or higher  Black, tarry-looking stools  For urgent or emergent issues, a gastroenterologist can be reached at any hour by calling [phone]. Do not use MyChart messaging for urgent concerns.    DIET:  We do recommend a small meal at first, but then you may proceed to your regular  diet.  Drink plenty of fluids but you should avoid alcoholic beverages for 24 hours.  ACTIVITY:  You should plan to take it easy for the rest of today and you should NOT DRIVE or use heavy machinery until tomorrow (because of the sedation medicines used during the test).    FOLLOW UP: Our staff will call the number listed on your records the next business day following your procedure.  We will call around 7:15- 8:00 am to check on you and address any questions or concerns that you may have regarding the information given to you following your procedure. If we do not reach you, we will leave a message.     If any biopsies were taken you will be contacted by phone or by letter within the next 1-3 weeks.  Please call us  at [phone] if you have not heard about the biopsies in 3 weeks.    SIGNATURES/CONFIDENTIALITY: You and/or your care partner have signed paperwork which will be entered into your electronic medical record.  These signatures attest to the fact that that the information above on your After Visit Summary has been reviewed and is understood.  Full responsibility of the confidentiality of this discharge information lies with you and/or your care-partner.

## 2024-03-15 ENCOUNTER — Telehealth: Payer: Self-pay

## 2024-03-15 NOTE — Telephone Encounter (Signed)
 Left message on follow up call.

## 2024-03-18 ENCOUNTER — Ambulatory Visit: Payer: Self-pay | Admitting: Gastroenterology

## 2024-03-18 LAB — SURGICAL PATHOLOGY

## 2024-03-19 ENCOUNTER — Other Ambulatory Visit: Payer: Self-pay

## 2024-03-19 DIAGNOSIS — A048 Other specified bacterial intestinal infections: Secondary | ICD-10-CM

## 2024-03-19 MED ORDER — TETRACYCLINE HCL 500 MG PO CAPS: 500.0000 mg | ORAL_CAPSULE | Freq: Four times a day (QID) | ORAL | 0 refills | Status: AC

## 2024-03-19 MED ORDER — METRONIDAZOLE 250 MG PO TABS: 500.0000 mg | ORAL_TABLET | Freq: Four times a day (QID) | ORAL | 0 refills | Status: AC

## 2024-03-19 MED ORDER — BISMUTH SUBSALICYLATE 262 MG PO TABS: 2.0000 | ORAL_TABLET | Freq: Four times a day (QID) | ORAL | 0 refills | Status: AC

## 2024-03-19 MED ORDER — OMEPRAZOLE 20 MG PO CPDR: 20.0000 mg | DELAYED_RELEASE_CAPSULE | Freq: Two times a day (BID) | ORAL | 0 refills | Status: AC
